# Patient Record
Sex: Female | Born: 1995 | Race: White | Hispanic: No | Marital: Single | Smoking: Former smoker
Health system: Southern US, Community
[De-identification: ages and names within clinical notes are randomized; demographics above are authoritative.]

## PROBLEM LIST (undated history)

## (undated) DIAGNOSIS — R319 Hematuria, unspecified: Secondary | ICD-10-CM

## (undated) DIAGNOSIS — K311 Adult hypertrophic pyloric stenosis: Secondary | ICD-10-CM

## (undated) DIAGNOSIS — R350 Frequency of micturition: Secondary | ICD-10-CM

## (undated) HISTORY — DX: Adult hypertrophic pyloric stenosis: K31.1

## (undated) HISTORY — PX: PYLOROPLASTY: SHX418

## (undated) HISTORY — DX: Hematuria, unspecified: R31.9

## (undated) HISTORY — DX: Frequency of micturition: R35.0

---
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
## 2015-04-08 ENCOUNTER — Ambulatory Visit (INDEPENDENT_AMBULATORY_CARE_PROVIDER_SITE_OTHER): Payer: PRIVATE HEALTH INSURANCE | Admitting: Family Medicine

## 2015-04-08 ENCOUNTER — Encounter: Payer: Self-pay | Admitting: Family Medicine

## 2015-04-08 VITALS — BP 97/65 | HR 78 | Temp 98.3°F | Ht 63.4 in | Wt 116.0 lb

## 2015-04-08 DIAGNOSIS — R509 Fever, unspecified: Secondary | ICD-10-CM

## 2015-04-08 DIAGNOSIS — J029 Acute pharyngitis, unspecified: Secondary | ICD-10-CM

## 2015-04-08 MED ORDER — HYDROCOD POLST-CPM POLST ER 10-8 MG/5ML PO SUER: 5.0000 mL | Freq: Every evening | ORAL | Status: DC | PRN

## 2015-04-08 MED ORDER — BENZONATATE 200 MG PO CAPS
200.0000 mg | ORAL_CAPSULE | Freq: Three times a day (TID) | ORAL | Status: DC | PRN
Start: 2015-04-08 — End: 2015-05-27

## 2015-04-08 NOTE — Progress Notes (Signed)
BP 97/65 mmHg  Pulse 78  Temp(Src) 98.3 F (36.8 C)  Ht 5' 3.4" (1.61 m)  Wt 116 lb (52.617 kg)  BMI 20.30 kg/m2  SpO2 100%  LMP 03/25/2015 (Approximate)   Subjective:    Patient ID: Sherry Welch, female    DOB: [birth year], 19 y.o.   MRN: [id number]  HPI: Sherry Welch is a 19 y.o. female  Chief Complaint  Patient presents with  . Sore Throat    Patient states that she is having some stomach pain, low grade fever and a sore throat.   UPPER RESPIRATORY TRACT INFECTION Duration: 3-4 days Worst symptom: sore throat Fever: yes Cough: yes Shortness of breath: no Wheezing: no Chest pain: no Chest tightness: no Chest congestion: no Nasal congestion: no Runny nose: no Post nasal drip: no Sneezing: no Sore throat: yes Swollen glands: yes Sinus pressure: no Headache: yes Face pain: no Toothache: yes- getting her wisdom teeth in Ear pain: yes "right Ear pressure: yes "right Eyes red/itching:no Eye drainage/crusting: no  Vomiting: no Rash: no Fatigue: yes Sick contacts: yes Strep contacts: no  Context: stable Recurrent sinusitis: no Relief with OTC cold/cough medications: no  Treatments attempted: cold/sinus   Relevant past medical, surgical, family and social history reviewed and updated as indicated. Interim medical history since our last visit reviewed. Allergies and medications reviewed and updated.  Review of Systems  HENT: Negative.   Respiratory: Negative.   Cardiovascular: Negative.   Psychiatric/Behavioral: Negative.     Per HPI unless specifically indicated above     Objective:    BP 97/65 mmHg  Pulse 78  Temp(Src) 98.3 F (36.8 C)  Ht 5' 3.4" (1.61 m)  Wt 116 lb (52.617 kg)  BMI 20.30 kg/m2  SpO2 100%  LMP 03/25/2015 (Approximate)  Wt Readings from Last 3 Encounters:  04/08/15 116 lb (52.617 kg) (26 %*, Z = -0.64)  10/29/14 130 lb (58.968 kg) (56 %*, Z = 0.14)   * Growth percentiles are based on CDC 2-20 Years data.    Physical  Exam  Constitutional: She is oriented to person, place, and time. She appears well-developed and well-nourished. No distress.  HENT:  Head: Normocephalic and atraumatic.  Right Ear: Hearing and external ear normal.  Left Ear: Hearing and external ear normal.  Nose: Nose normal. Right sinus exhibits no maxillary sinus tenderness and no frontal sinus tenderness. Left sinus exhibits no maxillary sinus tenderness and no frontal sinus tenderness.  Mouth/Throat: Uvula is midline and mucous membranes are normal. Posterior oropharyngeal edema and posterior oropharyngeal erythema present. No oropharyngeal exudate or tonsillar abscesses.    Eyes: Conjunctivae, EOM and lids are normal. Pupils are equal, round, and reactive to light. Right eye exhibits no discharge. Left eye exhibits no discharge. No scleral icterus.  Neck: Normal range of motion. Neck supple. No JVD present. No tracheal deviation present. No thyromegaly present.  Cardiovascular: Normal rate, regular rhythm, normal heart sounds and intact distal pulses.  Exam reveals no gallop and no friction rub.   No murmur heard. Pulmonary/Chest: Effort normal and breath sounds normal. No stridor. No respiratory distress. She has no wheezes. She has no rales. She exhibits no tenderness.  Musculoskeletal: Normal range of motion.  Lymphadenopathy:    She has cervical adenopathy.  Neurological: She is alert and oriented to person, place, and time.  Skin: Skin is intact. No rash noted.  Psychiatric: She has a normal mood and affect. Her speech is normal and behavior is normal. Judgment and thought  content normal. Cognition and memory are normal.  Nursing note and vitals reviewed.       Assessment & Plan:   Problem List Items Addressed This Visit    None    Visit Diagnoses    Sore throat    -  Primary    Strep negative. Looks like coxsackie virus. Will treat symptomatically and monitor. Await strep culture. Call if not getting better or getting  worse.     Relevant Orders    Rapid strep screen (not at [facility])    Fever, unspecified fever cause        Relevant Orders    Rapid strep screen (not at [facility])        Follow up plan: Return 6-8 weeks, for for physical.

## 2015-04-10 LAB — RAPID STREP SCREEN (MED CTR MEBANE ONLY): STREP A CULTURE: NEGATIVE

## 2015-05-27 ENCOUNTER — Encounter (INDEPENDENT_AMBULATORY_CARE_PROVIDER_SITE_OTHER): Payer: Self-pay

## 2015-05-27 ENCOUNTER — Encounter: Payer: Self-pay | Admitting: Family Medicine

## 2015-05-27 ENCOUNTER — Ambulatory Visit (INDEPENDENT_AMBULATORY_CARE_PROVIDER_SITE_OTHER): Payer: PRIVATE HEALTH INSURANCE | Admitting: Family Medicine

## 2015-05-27 VITALS — BP 133/87 | HR 65 | Temp 98.7°F | Ht 63.3 in | Wt 116.0 lb

## 2015-05-27 DIAGNOSIS — Z0000 Encounter for general adult medical examination without abnormal findings: Secondary | ICD-10-CM | POA: Diagnosis not present

## 2015-05-27 DIAGNOSIS — Z113 Encounter for screening for infections with a predominantly sexual mode of transmission: Secondary | ICD-10-CM

## 2015-05-27 LAB — UA/M W/RFLX CULTURE, ROUTINE
BILIRUBIN UA: NEGATIVE
Glucose, UA: NEGATIVE
Ketones, UA: NEGATIVE
Leukocytes, UA: NEGATIVE
Nitrite, UA: NEGATIVE
PH UA: 6 (ref 5.0–7.5)
PROTEIN UA: NEGATIVE
RBC, UA: NEGATIVE
Specific Gravity, UA: 1.005 (ref 1.005–1.030)
Urobilinogen, Ur: 0.2 mg/dL (ref 0.2–1.0)

## 2015-05-27 NOTE — Progress Notes (Signed)
BP 133/87 mmHg  Pulse 65  Temp(Src) 98.7 F (37.1 C)  Ht 5' 3.3" (1.608 m)  Wt 116 lb (52.617 kg)  BMI 20.35 kg/m2  SpO2 100%  LMP 05/21/2015 (Approximate)   Subjective:    Patient ID: Sherry Welch, female    DOB: Mar 06, 1996, 20 y.o.   MRN: [id number]  HPI: Sherry Welch is a 20 y.o. female presenting on 05/27/2015 for comprehensive medical examination. Current medical complaints include: none  She currently lives with: boyfriends Menopausal Symptoms: no  Depression Screen done today and results listed below:  Depression screen PHQ 2/9 05/27/2015  Decreased Interest 0  Down, Depressed, Hopeless 0  PHQ - 2 Score 0     Past Medical History:  Past Medical History  Diagnosis Date  . 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 Pyloric stenosis      Repair    Medications:  Current Outpatient Prescriptions on File Prior to Visit  Medication Sig  . SPRINTEC 28 0.25-35 MG-MCG tablet Take 1 tablet by mouth daily.   No current facility-administered medications on file prior to visit.    Allergies:  No Known Allergies  Social History:  Social History   Social History  . Marital Status: Single    Spouse Name: N/A  . Number of Children: N/A  . Years of Education: N/A   Occupational History  . Not on file.   Social History Main Topics  . Smoking status: Never Smoker   . Smokeless tobacco: Never Used  . Alcohol Use: 0.0 oz/week    0 Standard drinks or equivalent per week     Comment: Socially   . Drug Use: No  . Sexual Activity: Yes    Birth Control/ Protection: Pill   Other Topics Concern  . Not on file   Social History Narrative   History  Smoking status  . Never Smoker   Smokeless tobacco  . Never Used   History  Alcohol Use  . 0.0 oz/week  . 0 Standard drinks or equivalent per week    Comment: Socially     Family History:  Family History  Problem Relation Age of Onset  .  Diabetes Maternal Grandmother   . Parkinson's disease Maternal Grandfather     Past medical history, surgical history, medications, allergies, family history and social history reviewed with patient today and changes made to appropriate areas of the chart.   Review of Systems  Constitutional: Negative.   HENT: Negative.   Eyes: Negative.   Respiratory: Negative.   Cardiovascular: Negative.   Gastrointestinal: Negative.   Genitourinary: Negative.   Musculoskeletal: Negative.   Skin: Negative.   Neurological: Negative.   Endo/Heme/Allergies: Negative.   Psychiatric/Behavioral: Negative.     All other ROS negative except what is listed above and in the HPI.      Objective:    BP 133/87 mmHg  Pulse 65  Temp(Src) 98.7 F (37.1 C)  Ht 5' 3.3" (1.608 m)  Wt 116 lb (52.617 kg)  BMI 20.35 kg/m2  SpO2 100%  LMP 05/21/2015 (Approximate)  Wt Readings from Last 3 Encounters:  05/27/15 116 lb (52.617 kg) (26 %*, Z = -0.65)  04/08/15 116 lb (52.617 kg) (26 %*, Z = -0.64)  10/29/14 130 lb (58.968 kg) (56 %*, Z = 0.14)   * Growth percentiles are based on CDC 2-20 Years data.    Physical Exam  Constitutional: She is oriented to person, place, and time. She appears well-developed and well-nourished. No distress.  HENT:  Head: Normocephalic and atraumatic.  Right Ear: Hearing, tympanic membrane, external ear and ear canal normal.  Left Ear: Hearing, tympanic membrane, external ear and ear canal normal.  Nose: Nose normal.  Mouth/Throat: Uvula is midline, oropharynx is clear and moist and mucous membranes are normal. No oropharyngeal exudate.  Eyes: Conjunctivae, EOM and lids are normal. Pupils are equal, round, and reactive to light. Right eye exhibits no discharge. Left eye exhibits no discharge. No scleral icterus.  Neck: Normal range of motion. Neck supple. No JVD present. No tracheal deviation present. No thyromegaly present.  Cardiovascular: Normal rate, regular rhythm, normal  heart sounds and intact distal pulses.  Exam reveals no gallop and no friction rub.   No murmur heard. Pulmonary/Chest: Effort normal and breath sounds normal. No stridor. No respiratory distress. She has no wheezes. She has no rales. She exhibits no tenderness.  Abdominal: Soft. Bowel sounds are normal. She exhibits no distension and no mass. There is no tenderness. There is no rebound and no guarding.  Genitourinary:  Deferred, done at GYN  Musculoskeletal: Normal range of motion. She exhibits no edema or tenderness.  Lymphadenopathy:    She has no cervical adenopathy.  Neurological: She is alert and oriented to person, place, and time. She has normal reflexes. She displays normal reflexes. No cranial nerve deficit. She exhibits normal muscle tone. Coordination normal.  Skin: Skin is warm, dry and intact. No rash noted. No erythema. No pallor.  Psychiatric: She has a normal mood and affect. Her speech is normal and behavior is normal. Judgment and thought content normal. Cognition and memory are normal.  Nursing note and vitals reviewed.   Results for orders placed or performed in visit on 04/08/15  Rapid strep screen (not at [facility])  Result Value Ref Range   Strep A Culture Negative       Assessment & Plan:   Problem List Items Addressed This Visit    None    Visit Diagnoses    Routine general medical examination at a health care facility    -  Primary    Up to date on vaccines. Screening labs checked today. Continue diet and exercise. Continue to monitor.     Relevant Orders    CBC with Differential/Platelet    Comprehensive metabolic panel    Lipid Panel w/o Chol/HDL Ratio    TSH    UA/M w/rflx Culture, Routine    Routine screening for STI (sexually transmitted infection)        Would like to be screened. Labs drawn today. Await results.     Relevant Orders    Hepatitis, Acute    RPR    GC/Chlamydia Probe Amp    HIV antibody    HSV(herpes simplex vrs) 1+2 ab-IgG         Follow up plan: Return in about 1 year (around 05/26/2016) for PE.   LABORATORY TESTING:  - Pap smear: done elsewhere  IMMUNIZATIONS:   - Tdap: Tetanus vaccination status reviewed: last tetanus booster within 10 years. - Influenza: Refused  PATIENT COUNSELING:   Advised to take 1 mg of folate supplement per day if capable of pregnancy.   Sexuality: Discussed sexually transmitted diseases, partner selection, use of condoms, avoidance of unintended pregnancy  and contraceptive alternatives.   Advised to avoid cigarette smoking.  I discussed with the patient that most people either abstain from alcohol  or drink within safe limits (<=14/week and <=4 drinks/occasion for males, <=7/weeks and <= 3 drinks/occasion for females) and that the risk for alcohol disorders and other health effects rises proportionally with the number of drinks per week and how often a drinker exceeds daily limits.  Discussed cessation/primary prevention of drug use and availability of treatment for abuse.   Diet: Encouraged to adjust caloric intake to maintain  or achieve ideal body weight, to reduce intake of dietary saturated fat and total fat, to limit sodium intake by avoiding high sodium foods and not adding table salt, and to maintain adequate dietary potassium and calcium preferably from fresh fruits, vegetables, and low-fat dairy products.    stressed the importance of regular exercise  Injury prevention: Discussed safety belts, safety helmets, smoke detector, smoking near bedding or upholstery.   Dental health: Discussed importance of regular tooth brushing, flossing, and dental visits.    NEXT PREVENTATIVE PHYSICAL DUE IN 1 YEAR. Return in about 1 year (around 05/26/2016) for PE.

## 2015-05-27 NOTE — Patient Instructions (Signed)
Health Maintenance, Female Adopting a healthy lifestyle and getting preventive care can go a long way to promote health and wellness. Talk with your health care provider about what schedule of regular examinations is right for you. This is a good chance for you to check in with your provider about disease prevention and staying healthy. In between checkups, there are plenty of things you can do on your own. Experts have done a lot of research about which lifestyle changes and preventive measures are most likely to keep you healthy. Ask your health care provider for more information. WEIGHT AND DIET  Eat a healthy diet  Be sure to include plenty of vegetables, fruits, low-fat dairy products, and lean protein.  Do not eat a lot of foods high in solid fats, added sugars, or salt.  Get regular exercise. This is one of the most important things you can do for your health.  Most adults should exercise for at least 150 minutes each week. The exercise should increase your heart rate and make you sweat (moderate-intensity exercise).  Most adults should also do strengthening exercises at least twice a week. This is in addition to the moderate-intensity exercise.  Maintain a healthy weight  Body mass index (BMI) is a measurement that can be used to identify possible weight problems. It estimates body fat based on height and weight. Your health care provider can help determine your BMI and help you achieve or maintain a healthy weight.  For females 20 years of age and older:   A BMI below 18.5 is considered underweight.  A BMI of 18.5 to 24.9 is normal.  A BMI of 25 to 29.9 is considered overweight.  A BMI of 30 and above is considered obese.  Watch levels of cholesterol and blood lipids  You should start having your blood tested for lipids and cholesterol at 20 years of age, then have this test every 5 years.  You may need to have your cholesterol levels checked more often if:  Your lipid  or cholesterol levels are high.  You are older than 20 years of age.  You are at high risk for heart disease.  CANCER SCREENING   Lung Cancer  Lung cancer screening is recommended for adults 55-80 years old who are at high risk for lung cancer because of a history of smoking.  A yearly low-dose CT scan of the lungs is recommended for people who:  Currently smoke.  Have quit within the past 15 years.  Have at least a 30-pack-year history of smoking. A pack year is smoking an average of one pack of cigarettes a day for 1 year.  Yearly screening should continue until it has been 15 years since you quit.  Yearly screening should stop if you develop a health problem that would prevent you from having lung cancer treatment.  Breast Cancer  Practice breast self-awareness. This means understanding how your breasts normally appear and feel.  It also means doing regular breast self-exams. Let your health care provider know about any changes, no matter how small.  If you are in your 20s or 30s, you should have a clinical breast exam (CBE) by a health care provider every 1-3 years as part of a regular health exam.  If you are 40 or older, have a CBE every year. Also consider having a breast X-ray (mammogram) every year.  If you have a family history of breast cancer, talk to your health care provider about genetic screening.  If you   are at high risk for breast cancer, talk to your health care provider about having an MRI and a mammogram every year.  Breast cancer gene (BRCA) assessment is recommended for women who have family members with BRCA-related cancers. BRCA-related cancers include:  Breast.  Ovarian.  Tubal.  Peritoneal cancers.  Results of the assessment will determine the need for genetic counseling and BRCA1 and BRCA2 testing. Cervical Cancer Your health care provider may recommend that you be screened regularly for cancer of the pelvic organs (ovaries, uterus, and  vagina). This screening involves a pelvic examination, including checking for microscopic changes to the surface of your cervix (Pap test). You may be encouraged to have this screening done every 3 years, beginning at age 21.  For women ages 30-65, health care providers may recommend pelvic exams and Pap testing every 3 years, or they may recommend the Pap and pelvic exam, combined with testing for human papilloma virus (HPV), every 5 years. Some types of HPV increase your risk of cervical cancer. Testing for HPV may also be done on women of any age with unclear Pap test results.  Other health care providers may not recommend any screening for nonpregnant women who are considered low risk for pelvic cancer and who do not have symptoms. Ask your health care provider if a screening pelvic exam is right for you.  If you have had past treatment for cervical cancer or a condition that could lead to cancer, you need Pap tests and screening for cancer for at least 20 years after your treatment. If Pap tests have been discontinued, your risk factors (such as having a new sexual partner) need to be reassessed to determine if screening should resume. Some women have medical problems that increase the chance of getting cervical cancer. In these cases, your health care provider may recommend more frequent screening and Pap tests. Colorectal Cancer  This type of cancer can be detected and often prevented.  Routine colorectal cancer screening usually begins at 20 years of age and continues through 20 years of age.  Your health care provider may recommend screening at an earlier age if you have risk factors for colon cancer.  Your health care provider may also recommend using home test kits to check for hidden blood in the stool.  A small camera at the end of a tube can be used to examine your colon directly (sigmoidoscopy or colonoscopy). This is done to check for the earliest forms of colorectal  cancer.  Routine screening usually begins at age 50.  Direct examination of the colon should be repeated every 5-10 years through 20 years of age. However, you may need to be screened more often if early forms of precancerous polyps or small growths are found. Skin Cancer  Check your skin from head to toe regularly.  Tell your health care provider about any new moles or changes in moles, especially if there is a change in a mole's shape or color.  Also tell your health care provider if you have a mole that is larger than the size of a pencil eraser.  Always use sunscreen. Apply sunscreen liberally and repeatedly throughout the day.  Protect yourself by wearing long sleeves, pants, a wide-brimmed hat, and sunglasses whenever you are outside. HEART DISEASE, DIABETES, AND HIGH BLOOD PRESSURE   High blood pressure causes heart disease and increases the risk of stroke. High blood pressure is more likely to develop in:  People who have blood pressure in the high end   of the normal range (130-139/85-89 mm Hg).  People who are overweight or obese.  People who are African American.  If you are 38-23 years of age, have your blood pressure checked every 3-5 years. If you are 61 years of age or older, have your blood pressure checked every year. You should have your blood pressure measured twice--once when you are at a hospital or clinic, and once when you are not at a hospital or clinic. Record the average of the two measurements. To check your blood pressure when you are not at a hospital or clinic, you can use:  An automated blood pressure machine at a pharmacy.  A home blood pressure monitor.  If you are between 45 years and 39 years old, ask your health care provider if you should take aspirin to prevent strokes.  Have regular diabetes screenings. This involves taking a blood sample to check your fasting blood sugar level.  If you are at a normal weight and have a low risk for diabetes,  have this test once every three years after 20 years of age.  If you are overweight and have a high risk for diabetes, consider being tested at a younger age or more often. PREVENTING INFECTION  Hepatitis B  If you have a higher risk for hepatitis B, you should be screened for this virus. You are considered at high risk for hepatitis B if:  You were born in a country where hepatitis B is common. Ask your health care provider which countries are considered high risk.  Your parents were born in a high-risk country, and you have not been immunized against hepatitis B (hepatitis B vaccine).  You have HIV or AIDS.  You use needles to inject street drugs.  You live with someone who has hepatitis B.  You have had sex with someone who has hepatitis B.  You get hemodialysis treatment.  You take certain medicines for conditions, including cancer, organ transplantation, and autoimmune conditions. Hepatitis C  Blood testing is recommended for:  Everyone born from [birth year] through 1965.  Anyone with known risk factors for hepatitis C. Sexually transmitted infections (STIs)  You should be screened for sexually transmitted infections (STIs) including gonorrhea and chlamydia if:  You are sexually active and are younger than 20 years of age.  You are older than 20 years of age and your health care provider tells you that you are at risk for this type of infection.  Your sexual activity has changed since you were last screened and you are at an increased risk for chlamydia or gonorrhea. Ask your health care provider if you are at risk.  If you do not have HIV, but are at risk, it may be recommended that you take a prescription medicine daily to prevent HIV infection. This is called pre-exposure prophylaxis (PrEP). You are considered at risk if:  You are sexually active and do not regularly use condoms or know the HIV status of your partner(s).  You take drugs by injection.  You are sexually  active with a partner who has HIV. Talk with your health care provider about whether you are at high risk of being infected with HIV. If you choose to begin PrEP, you should first be tested for HIV. You should then be tested every 3 months for as long as you are taking PrEP.  PREGNANCY   If you are premenopausal and you may become pregnant, ask your health care provider about preconception counseling.  If you may  become pregnant, take 400 to 800 micrograms (mcg) of folic acid every day.  If you want to prevent pregnancy, talk to your health care provider about birth control (contraception). OSTEOPOROSIS AND MENOPAUSE   Osteoporosis is a disease in which the bones lose minerals and strength with aging. This can result in serious bone fractures. Your risk for osteoporosis can be identified using a bone density scan.  If you are 61 years of age or older, or if you are at risk for osteoporosis and fractures, ask your health care provider if you should be screened.  Ask your health care provider whether you should take a calcium or vitamin D supplement to lower your risk for osteoporosis.  Menopause may have certain physical symptoms and risks.  Hormone replacement therapy may reduce some of these symptoms and risks. Talk to your health care provider about whether hormone replacement therapy is right for you.  HOME CARE INSTRUCTIONS   Schedule regular health, dental, and eye exams.  Stay current with your immunizations.   Do not use any tobacco products including cigarettes, chewing tobacco, or electronic cigarettes.  If you are pregnant, do not drink alcohol.  If you are breastfeeding, limit how much and how often you drink alcohol.  Limit alcohol intake to no more than 1 drink per day for nonpregnant women. One drink equals 12 ounces of beer, 5 ounces of wine, or 1 ounces of hard liquor.  Do not use street drugs.  Do not share needles.  Ask your health care provider for help if  you need support or information about quitting drugs.  Tell your health care provider if you often feel depressed.  Tell your health care provider if you have ever been abused or do not feel safe at home.   This information is not intended to replace advice given to you by your health care provider. Make sure you discuss any questions you have with your health care provider.   Document Released: 11/23/2010 Document Revised: 05/31/2014 Document Reviewed: 04/11/2013 Elsevier Interactive Patient Education [employer].

## 2015-05-28 ENCOUNTER — Telehealth: Payer: Self-pay | Admitting: Family Medicine

## 2015-05-28 DIAGNOSIS — D509 Iron deficiency anemia, unspecified: Secondary | ICD-10-CM | POA: Insufficient documentation

## 2015-05-28 LAB — CBC WITH DIFFERENTIAL/PLATELET
BASOS ABS: 0.1 10*3/uL (ref 0.0–0.2)
Basos: 3 %
EOS (ABSOLUTE): 0.2 10*3/uL (ref 0.0–0.4)
Eos: 4 %
HEMOGLOBIN: 7.4 g/dL — AB (ref 11.1–15.9)
Hematocrit: 26.1 % — ABNORMAL LOW (ref 34.0–46.6)
IMMATURE GRANULOCYTES: 0 %
Immature Grans (Abs): 0 10*3/uL (ref 0.0–0.1)
LYMPHS: 45 %
Lymphocytes Absolute: 1.7 10*3/uL (ref 0.7–3.1)
MCH: 18.7 pg — AB (ref 26.6–33.0)
MCHC: 28.4 g/dL — ABNORMAL LOW (ref 31.5–35.7)
MCV: 66 fL — ABNORMAL LOW (ref 79–97)
MONOCYTES: 7 %
Monocytes Absolute: 0.3 10*3/uL (ref 0.1–0.9)
NEUTROS ABS: 1.6 10*3/uL (ref 1.4–7.0)
Neutrophils: 41 %
Platelets: 190 10*3/uL (ref 150–379)
RBC: 3.95 x10E6/uL (ref 3.77–5.28)
RDW: 19.2 % — ABNORMAL HIGH (ref 12.3–15.4)
WBC: 3.8 10*3/uL (ref 3.4–10.8)

## 2015-05-28 LAB — COMPREHENSIVE METABOLIC PANEL
A/G RATIO: 1.5 (ref 1.1–2.5)
ALT: 18 IU/L (ref 0–32)
AST: 23 IU/L (ref 0–40)
Albumin: 4.2 g/dL (ref 3.5–5.5)
Alkaline Phosphatase: 62 IU/L (ref 39–117)
BILIRUBIN TOTAL: 0.8 mg/dL (ref 0.0–1.2)
BUN/Creatinine Ratio: 13 (ref 8–20)
BUN: 10 mg/dL (ref 6–20)
CALCIUM: 9 mg/dL (ref 8.7–10.2)
CO2: 21 mmol/L (ref 18–29)
Chloride: 102 mmol/L (ref 96–106)
Creatinine, Ser: 0.8 mg/dL (ref 0.57–1.00)
GFR calc Af Amer: 124 mL/min/{1.73_m2} (ref >59)
GFR calc non Af Amer: 107 mL/min/{1.73_m2} (ref >59)
Globulin, Total: 2.8 g/dL (ref 1.5–4.5)
Glucose: 91 mg/dL (ref 65–99)
POTASSIUM: 3.9 mmol/L (ref 3.5–5.2)
Sodium: 141 mmol/L (ref 134–144)
Total Protein: 7 g/dL (ref 6.0–8.5)

## 2015-05-28 LAB — LIPID PANEL W/O CHOL/HDL RATIO
CHOLESTEROL TOTAL: 143 mg/dL (ref 100–169)
HDL: 64 mg/dL (ref >39)
LDL CALC: 63 mg/dL (ref 0–109)
TRIGLYCERIDES: 78 mg/dL (ref 0–89)
VLDL Cholesterol Cal: 16 mg/dL (ref 5–40)

## 2015-05-28 LAB — TSH: TSH: 1.46 u[IU]/mL (ref 0.450–4.500)

## 2015-05-28 LAB — HEPATITIS PANEL, ACUTE
HEP B C IGM: NEGATIVE
HEP B S AG: NEGATIVE
Hep A IgM: NEGATIVE

## 2015-05-28 LAB — HSV(HERPES SIMPLEX VRS) I + II AB-IGG: HSV 1 Glycoprotein G Ab, IgG: <0.91 index (ref 0.00–0.90)

## 2015-05-28 LAB — RPR: RPR Ser Ql: NONREACTIVE

## 2015-05-28 LAB — GC/CHLAMYDIA PROBE AMP
CHLAMYDIA, DNA PROBE: NEGATIVE
NEISSERIA GONORRHOEAE BY PCR: NEGATIVE

## 2015-05-28 LAB — HIV ANTIBODY (ROUTINE TESTING W REFLEX): HIV Screen 4th Generation wRfx: NONREACTIVE

## 2015-05-28 MED ORDER — FERROUS SULFATE 325 (65 FE) MG PO TABS: 325.0000 mg | ORAL_TABLET | Freq: Two times a day (BID) | ORAL | Status: DC

## 2015-05-28 NOTE — Telephone Encounter (Signed)
Called to tell Sherry Welch her lab work. Normal except anemia. Will start iron supplement. Will recheck in 2 weeks.

## 2015-05-29 ENCOUNTER — Encounter: Payer: Self-pay | Admitting: Family Medicine

## 2015-06-13 ENCOUNTER — Other Ambulatory Visit: Payer: PRIVATE HEALTH INSURANCE

## 2015-06-13 DIAGNOSIS — D509 Iron deficiency anemia, unspecified: Secondary | ICD-10-CM

## 2015-06-14 LAB — CBC WITH DIFFERENTIAL/PLATELET
Basophils Absolute: 0.1 10*3/uL (ref 0.0–0.2)
Basos: 1 %
EOS (ABSOLUTE): 0.1 10*3/uL (ref 0.0–0.4)
EOS: 2 %
HEMATOCRIT: 32.1 % — AB (ref 34.0–46.6)
HEMOGLOBIN: 9.4 g/dL — AB (ref 11.1–15.9)
Immature Grans (Abs): 0 10*3/uL (ref 0.0–0.1)
Immature Granulocytes: 0 %
LYMPHS ABS: 2.5 10*3/uL (ref 0.7–3.1)
Lymphs: 40 %
MCH: 21.5 pg — AB (ref 26.6–33.0)
MCHC: 29.3 g/dL — AB (ref 31.5–35.7)
MCV: 73 fL — AB (ref 79–97)
MONOCYTES: 7 %
MONOS ABS: 0.5 10*3/uL (ref 0.1–0.9)
NEUTROS ABS: 3.2 10*3/uL (ref 1.4–7.0)
Neutrophils: 50 %
Platelets: 872 10*3/uL (ref 150–379)
RBC: 4.38 x10E6/uL (ref 3.77–5.28)
RDW: 28.6 % — ABNORMAL HIGH (ref 12.3–15.4)
WBC: 6.4 10*3/uL (ref 3.4–10.8)

## 2015-06-16 ENCOUNTER — Telehealth: Payer: Self-pay | Admitting: Family Medicine

## 2015-06-16 DIAGNOSIS — D473 Essential (hemorrhagic) thrombocythemia: Secondary | ICD-10-CM | POA: Insufficient documentation

## 2015-06-16 DIAGNOSIS — D75839 Thrombocytosis, unspecified: Secondary | ICD-10-CM | POA: Insufficient documentation

## 2015-06-16 NOTE — Telephone Encounter (Signed)
Platelets very high. Will recheck tomorrow to make sure not something with the lab.

## 2015-06-17 ENCOUNTER — Other Ambulatory Visit: Payer: PRIVATE HEALTH INSURANCE

## 2015-06-17 DIAGNOSIS — D75839 Thrombocytosis, unspecified: Secondary | ICD-10-CM

## 2015-06-17 DIAGNOSIS — D473 Essential (hemorrhagic) thrombocythemia: Secondary | ICD-10-CM

## 2015-06-18 ENCOUNTER — Telehealth: Payer: Self-pay | Admitting: Family Medicine

## 2015-06-18 DIAGNOSIS — D75839 Thrombocytosis, unspecified: Secondary | ICD-10-CM

## 2015-06-18 DIAGNOSIS — D473 Essential (hemorrhagic) thrombocythemia: Secondary | ICD-10-CM

## 2015-06-18 DIAGNOSIS — D509 Iron deficiency anemia, unspecified: Secondary | ICD-10-CM

## 2015-06-18 LAB — CBC WITH DIFFERENTIAL/PLATELET
BASOS: 1 %
Basophils Absolute: 0 10*3/uL (ref 0.0–0.2)
EOS (ABSOLUTE): 0.1 10*3/uL (ref 0.0–0.4)
EOS: 3 %
HEMATOCRIT: 34 % (ref 34.0–46.6)
HEMOGLOBIN: 9.7 g/dL — AB (ref 11.1–15.9)
IMMATURE GRANS (ABS): 0 10*3/uL (ref 0.0–0.1)
Immature Granulocytes: 0 %
LYMPHS: 41 %
Lymphocytes Absolute: 1.9 10*3/uL (ref 0.7–3.1)
MCH: 22.2 pg — ABNORMAL LOW (ref 26.6–33.0)
MCHC: 28.5 g/dL — ABNORMAL LOW (ref 31.5–35.7)
MCV: 78 fL — AB (ref 79–97)
MONOS ABS: 0.5 10*3/uL (ref 0.1–0.9)
Monocytes: 9 %
NEUTROS ABS: 2.2 10*3/uL (ref 1.4–7.0)
Neutrophils: 46 %
PLATELETS: 583 10*3/uL — AB (ref 150–379)
RBC: 4.37 x10E6/uL (ref 3.77–5.28)
WBC: 4.8 10*3/uL (ref 3.4–10.8)

## 2015-06-18 NOTE — Telephone Encounter (Signed)
Sherry Welch, can we get her on the lab schedule for a month from now? Thanks!

## 2015-06-18 NOTE — Telephone Encounter (Signed)
Called patient with the results. Platelet count much better, blood count better. Likely acute phase reactant. Will check back in 1 month to make sure platelets back down to normal. No need for heme referral at this time.

## 2015-07-21 ENCOUNTER — Other Ambulatory Visit: Payer: Self-pay

## 2016-05-27 ENCOUNTER — Encounter: Payer: PRIVATE HEALTH INSURANCE | Admitting: Family Medicine

## 2017-01-13 ENCOUNTER — Ambulatory Visit (INDEPENDENT_AMBULATORY_CARE_PROVIDER_SITE_OTHER): Payer: PRIVATE HEALTH INSURANCE | Admitting: Family Medicine

## 2017-01-13 VITALS — BP 124/60 | HR 105 | Wt 120.0 lb

## 2017-01-13 DIAGNOSIS — R109 Unspecified abdominal pain: Secondary | ICD-10-CM | POA: Diagnosis not present

## 2017-01-13 DIAGNOSIS — N760 Acute vaginitis: Secondary | ICD-10-CM | POA: Diagnosis not present

## 2017-01-13 DIAGNOSIS — B9689 Other specified bacterial agents as the cause of diseases classified elsewhere: Secondary | ICD-10-CM

## 2017-01-13 LAB — WET PREP FOR TRICH, YEAST, CLUE
CLUE CELL EXAM: POSITIVE — AB
Trichomonas Exam: NEGATIVE
YEAST EXAM: POSITIVE — AB

## 2017-01-13 LAB — URINALYSIS, ROUTINE W REFLEX MICROSCOPIC
Bilirubin, UA: NEGATIVE
Glucose, UA: NEGATIVE
Ketones, UA: NEGATIVE
LEUKOCYTES UA: NEGATIVE
Nitrite, UA: NEGATIVE
PH UA: 7.5 (ref 5.0–7.5)
RBC, UA: NEGATIVE
Specific Gravity, UA: 1.02 (ref 1.005–1.030)
Urobilinogen, Ur: 0.2 mg/dL (ref 0.2–1.0)

## 2017-01-13 LAB — MICROSCOPIC EXAMINATION: Bacteria, UA: NONE SEEN

## 2017-01-13 LAB — PREGNANCY, URINE: Preg Test, Ur: NEGATIVE

## 2017-01-13 MED ORDER — METRONIDAZOLE 500 MG PO TABS: 500.0000 mg | ORAL_TABLET | Freq: Two times a day (BID) | ORAL | 0 refills | Status: DC

## 2017-01-13 MED ORDER — TERBINAFINE HCL 250 MG PO TABS: 250.0000 mg | ORAL_TABLET | Freq: Every day | ORAL | 1 refills | Status: DC

## 2017-01-13 NOTE — Progress Notes (Signed)
BP 124/60   Pulse (!) 105   Wt 120 lb (54.4 kg)   SpO2 99%   BMI 21.06 kg/m    Subjective:    Patient ID: BLANKA ROCKHOLT, female    DOB: July 02, 1995, 21 y.o.   MRN: [id number]  HPI: ASHAWNA HANBACK is a 21 y.o. female  Chief Complaint  Patient presents with  . Vaginal Discharge  . Vaginal Itching  Patient last period 2 weeks ago taking birth control faithfully. Has developed some vaginal itching treated with over-the-counter but now some discharge but no associated smell. Having some suprapubic pressure and urinary discomfort.  Relevant past medical, surgical, family and social history reviewed and updated as indicated. Interim medical history since our last visit reviewed. Allergies and medications reviewed and updated.  Review of Systems  Constitutional: Negative.   Respiratory: Negative.   Cardiovascular: Negative.     Per HPI unless specifically indicated above     Objective:    BP 124/60   Pulse (!) 105   Wt 120 lb (54.4 kg)   SpO2 99%   BMI 21.06 kg/m   Wt Readings from Last 3 Encounters:  01/13/17 120 lb (54.4 kg)  05/27/15 116 lb (52.6 kg) (26 %, Z= -0.65)*  04/08/15 116 lb (52.6 kg) (26 %, Z= -0.64)*   * Growth percentiles are based on CDC 2-20 Years data.    Physical Exam  Constitutional: She is oriented to person, place, and time. She appears well-developed and well-nourished.  HENT:  Head: Normocephalic and atraumatic.  Eyes: Conjunctivae and EOM are normal.  Neck: Normal range of motion.  Cardiovascular: Normal rate, regular rhythm and normal heart sounds.   Pulmonary/Chest: Effort normal and breath sounds normal.  Musculoskeletal: Normal range of motion.  Neurological: She is alert and oriented to person, place, and time.  Skin: No erythema.  Psychiatric: She has a normal mood and affect. Her behavior is normal. Judgment and thought content normal.   vagina with thick discharge sent for wet prep which showed clue cells and yeast  cells.  Results for orders placed or performed in visit on 06/17/15  CBC with Differential/Platelet  Result Value Ref Range   WBC 4.8 3.4 - 10.8 x10E3/uL   RBC 4.37 3.77 - 5.28 x10E6/uL   Hemoglobin 9.7 (L) 11.1 - 15.9 g/dL   Hematocrit 34.0 34.0 - 46.6 %   MCV 78 (L) 79 - 97 fL   MCH 22.2 (L) 26.6 - 33.0 pg   MCHC 28.5 (L) 31.5 - 35.7 g/dL   Platelets 583 (H) 150 - 379 x10E3/uL   Neutrophils 46 %   Lymphs 41 %   Monocytes 9 %   Eos 3 %   Basos 1 %   Neutrophils Absolute 2.2 1.4 - 7.0 x10E3/uL   Lymphocytes Absolute 1.9 0.7 - 3.1 x10E3/uL   Monocytes Absolute 0.5 0.1 - 0.9 x10E3/uL   EOS (ABSOLUTE) 0.1 0.0 - 0.4 x10E3/uL   Basophils Absolute 0.0 0.0 - 0.2 x10E3/uL   Immature Granulocytes 0 %   Immature Grans (Abs) 0.0 0.0 - 0.1 x10E3/uL   Hematology Comments: Note:       Assessment & Plan:   Problem List Items Addressed This Visit    None    Visit Diagnoses    Abdominal cramping    -  Primary   Relevant Orders   Urinalysis, Routine w reflex microscopic   Pregnancy, urine   Wet Prep for Trich, Yeast, Clue (STAT)   Bacterial vaginosis  Discussed care and treatment of DVT also a yeast infection discuss alcohol and Flagyl patient does not drink   Relevant Medications   metroNIDAZOLE (FLAGYL) 500 MG tablet   terbinafine (LAMISIL) 250 MG tablet       Follow up plan: Return if symptoms worsen or fail to improve, for As scheduled.

## 2017-01-14 ENCOUNTER — Ambulatory Visit: Payer: Self-pay | Admitting: Obstetrics and Gynecology

## 2017-05-20 ENCOUNTER — Other Ambulatory Visit: Payer: Self-pay | Admitting: Obstetrics & Gynecology

## 2017-06-09 ENCOUNTER — Ambulatory Visit: Payer: PRIVATE HEALTH INSURANCE | Admitting: Obstetrics and Gynecology

## 2017-06-09 ENCOUNTER — Encounter: Payer: Self-pay | Admitting: Obstetrics and Gynecology

## 2017-06-09 ENCOUNTER — Other Ambulatory Visit: Payer: Self-pay

## 2017-06-09 VITALS — BP 124/80 | HR 88 | Ht 63.0 in | Wt 119.0 lb

## 2017-06-09 DIAGNOSIS — R102 Pelvic and perineal pain: Secondary | ICD-10-CM | POA: Diagnosis not present

## 2017-06-09 DIAGNOSIS — N938 Other specified abnormal uterine and vaginal bleeding: Secondary | ICD-10-CM | POA: Diagnosis not present

## 2017-06-09 DIAGNOSIS — R634 Abnormal weight loss: Secondary | ICD-10-CM | POA: Diagnosis not present

## 2017-06-09 LAB — POCT URINALYSIS DIPSTICK
BILIRUBIN UA: NEGATIVE
GLUCOSE UA: NEGATIVE
Ketones, UA: NEGATIVE
LEUKOCYTES UA: NEGATIVE
Nitrite, UA: NEGATIVE
Protein, UA: NEGATIVE
RBC UA: NEGATIVE
SPEC GRAV UA: 1.01 (ref 1.010–1.025)
pH, UA: 6 (ref 5.0–8.0)

## 2017-06-09 LAB — POCT URINE PREGNANCY: Preg Test, Ur: NEGATIVE

## 2017-06-09 NOTE — Progress Notes (Addendum)
Chief Complaint  Patient presents with  . Abdominal Pain    Lower Abdominal Pain x1 wk intermittent/4 days constant    HPI:      [name] is a 22 y.o. No obstetric history on file. who LMP was Patient's last menstrual period was 05/19/2017., presents today for 4 days mid cycle spotting after missing OCP last wk. Had a day of spotting after missed pill that resolved, until bleeding started again 06/06/17. Pt developed pelvic cramping/pain after sex 2 days ago. Cramping is intermittent and feels a little different than menstrual cramps. No aggrav/allev factors. Has tried NSAIDs with a little relief. NO vag sx, urin sx, GI sx. No fevers. No new sex partners. Occas LBP. Neg UPT last night.   Pt also concerned about 7-10# wt loss this yr. She is eating normally, feels great, no fatigue. Wt was 118# on our scales 9/16 and 125# 10/17. Family keeps mentioning it to pt.   Past Medical History:  Diagnosis Date  . 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 pyloric stenosis     Repair    Family History  Problem Relation Age of Onset  . Diabetes Maternal Grandmother   . Parkinson's disease Maternal Grandfather     Social History   Socioeconomic History  . Marital status: Single    Spouse name: Not on file  . Number of children: Not on file  . Years of education: Not on file  . Highest education level: Not on file  Social Needs  . Financial resource strain: Not on file  . Food insecurity - worry: Not on file  . Food insecurity - inability: Not on file  . Transportation needs - medical: Not on file  . Transportation needs - non-medical: Not on file  Occupational History  . Not on file  Tobacco Use  . Smoking status: Current Every Day Smoker    Packs/day: 0.01    Types: Cigarettes  . Smokeless tobacco: Never Used  . Tobacco comment: Social Smoker (Occasionally)  Substance and Sexual Activity  . Alcohol use: Yes   Alcohol/week: 0.0 oz    Comment: Socially   . Drug use: No  . Sexual activity: Yes    Birth control/protection: Pill  Other Topics Concern  . Not on file  Social History Narrative  . Not on file     Current Outpatient Medications:  .  SPRINTEC 28 0.25-35 MG-MCG tablet, TAKE 1 TABLET BY MOUTH DAILY, Disp: 84 tablet, Rfl: 3 .  metroNIDAZOLE (FLAGYL) 500 MG tablet, Take 1 tablet (500 mg total) by mouth 2 (two) times daily. (Patient not taking: Reported on 06/09/2017), Disp: 14 tablet, Rfl: 0 .  terbinafine (LAMISIL) 250 MG tablet, Take 1 tablet (250 mg total) by mouth daily. (Patient not taking: Reported on 06/09/2017), Disp: 1 tablet, Rfl: 1   ROS:  Review of Systems  Constitutional: Negative for fever.  Gastrointestinal: Negative for blood in stool, constipation, diarrhea, nausea and vomiting.  Genitourinary: Positive for dyspareunia, pelvic pain, vaginal bleeding and vaginal discharge. Negative for dysuria, flank pain, frequency, hematuria, urgency and vaginal pain.  Musculoskeletal: Negative for back pain.  Skin: Negative for rash.     OBJECTIVE:   Vitals:  BP 124/80 (BP Location: Left Arm, Patient Position: Sitting, Cuff Size: Normal)   Pulse 88   Ht 5\' 3"  (1.6 m)   Wt 119 lb (54 kg)   LMP 05/19/2017  BMI 21.08 kg/m   Physical Exam  Constitutional: She is oriented to person, place, and time and well-developed, well-nourished, and in no distress. Vital signs are normal.  Abdominal: Soft. She exhibits no mass. There is tenderness in the right lower quadrant, suprapubic area and left lower quadrant. There is no rigidity and no guarding.  Genitourinary: Vagina normal, cervix normal and vulva normal. Uterus is tender. Uterus is not enlarged. Cervix exhibits no motion tenderness and no tenderness. Right adnexum displays tenderness. Right adnexum displays no mass. Left adnexum displays tenderness. Left adnexum displays no mass. Vulva exhibits no erythema, no exudate, no lesion,  no rash and no tenderness. Vagina exhibits no lesion.  Neurological: She is oriented to person, place, and time.  Vitals reviewed.   Results: Results for orders placed or performed in visit on 06/09/17 (from the past 24 hour(s))  POCT Urinalysis Dipstick     Status: Normal   Collection Time: 06/09/17  5:08 PM  Result Value Ref Range   Color, UA yellow    Clarity, UA clear    Glucose, UA neg    Bilirubin, UA neg    Ketones, UA neg    Spec Grav, UA 1.010 1.010 - 1.025   Blood, UA neg    pH, UA 6.0 5.0 - 8.0   Protein, UA neg    Urobilinogen, UA  0.2 or 1.0 E.U./dL   Nitrite, UA neg    Leukocytes, UA Negative Negative   Appearance     Odor    POCT urine pregnancy     Status: Normal   Collection Time: 06/09/17  5:08 PM  Result Value Ref Range   Preg Test, Ur Negative Negative     Assessment/Plan: Pelvic pain - Neg dip/UPT. Slightly tender on exam. Most likely irreg cycle after missed OCP. Check GYN u/s. Will f/u with results. IF neg, follow next cycle. NSAIDs.  - Plan: US PELVIS TRANSVANGINAL NON-OB (TV ONLY), POCT Urinalysis Dipstick, POCT urine pregnancy, US Pelvis Complete, CANCELED: US PELVIS TRANSVANGINAL NON-OB (TV ONLY)  DUB (dysfunctional uterine bleeding) - Plan: US PELVIS TRANSVANGINAL NON-OB (TV ONLY), POCT urine pregnancy, US Pelvis Complete, CANCELED: US PELVIS TRANSVANGINAL NON-OB (TV ONLY)  Weight loss - Pt feels fine otherwise, eating normally. Due for annual--can do labs then since lab closed today.    Return in about 1 month (around 07/10/2017) for annual with [facility].  Alicia B. Copland, PA-C 06/10/2017 1:19 PM

## 2017-06-09 NOTE — Patient Instructions (Signed)
I value your feedback and entrusting us with your care. If you get a [facility] patient survey, I would appreciate you taking the time to let us know about your experience today. Thank you! 

## 2017-06-10 NOTE — Addendum Note (Signed)
Addended by: Ardeth Perfect B on: 7/84/7841 01:19 PM   Modules accepted: Orders

## 2017-06-16 ENCOUNTER — Telehealth: Payer: Self-pay

## 2017-06-16 NOTE — Telephone Encounter (Signed)
Will wait to see what u/s shows tomorrow. Getting it done for pelvic pain.

## 2017-06-16 NOTE — Telephone Encounter (Signed)
Pt has u/s sched for tomorrow.  Period started yesterday, is having super severe hot flashes to the point she is sweating out of her clothes, then she is freezing, has diarrhea, n/v.  States this has happened the last 97m and she just now noticed the pattern.  Annual is sched. 2/18.  What to do?  [phone]

## 2017-06-16 NOTE — Telephone Encounter (Signed)
Pt aware, would like to have results tomorrow.  Adv I'd let ABC know.

## 2017-06-17 ENCOUNTER — Ambulatory Visit (HOSPITAL_COMMUNITY): Payer: PRIVATE HEALTH INSURANCE

## 2017-06-20 NOTE — Telephone Encounter (Signed)
Pt never had u/s Friday--looks like she cancelled it. I just want to be able to follow up with her sx and review her report. Can you call pt to f/u with her plan? Thx.

## 2017-06-20 NOTE — Telephone Encounter (Signed)
Pt cancelled u/s b/c it was going to cost her over $1000.  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  She does states that right now she is not having the pelvic pain.  She just doesn't want to get sick again with her next period - projectile vomiting for one day, sweating so bad she can't stand to put clothes on, etc.  Please schedule c [facility].

## 2017-06-21 NOTE — Telephone Encounter (Signed)
Patient returned ABC call, same cb

## 2017-06-21 NOTE — Telephone Encounter (Signed)
Spoke with pt. Cramping/pelvic pain resolved after menses started, but resumed today. Mild discomfort but noticeable to pt. Getting u/s sched this wk at [facility]. Will f/u with results.  Pt also with severe n/v/d with menses last wk. Excessive sweating/feeling hot then chilled, but no fever. Had lesser sx previous 2 menses, and saw urgent care. Thought is was viral then, but pt now noticing pattern. Couldn't keep any food/water down. Finally feeling better yesterday after starting new pill pack. Pt had labs done at Urgent Care last wk. She will get them to fax [place] copy so I can review. Will order addl labs prn.

## 2017-06-21 NOTE — Telephone Encounter (Signed)
Pt called back. Got labs results via [facility]. CMP, CBC, thyroid panel WNL. Neg UPT, neg UA. Will wait to see what u/s then shows. If neg, may benefit from cont dosing OCPs.

## 2017-06-21 NOTE — Telephone Encounter (Signed)
LMTRC

## 2017-06-21 NOTE — Addendum Note (Signed)
Addended by: Ardeth Perfect B on: 8/47/8412 11:45 AM   Modules accepted: Orders

## 2017-06-22 ENCOUNTER — Telehealth: Payer: Self-pay | Admitting: Obstetrics and Gynecology

## 2017-06-22 NOTE — Telephone Encounter (Signed)
Patient has not heard from [facility] to have u/s scheduled, was told to let ABC know if she hadn't heard by the end of the day.

## 2017-06-23 NOTE — Telephone Encounter (Signed)
Can you pls check on this? You said referral was sent and they were supposed to contact her. Thx.

## 2017-06-23 NOTE — Telephone Encounter (Signed)
Thank you :)

## 2017-06-23 NOTE — Telephone Encounter (Signed)
Patient is aware of appointment, location, and instructions.

## 2017-06-23 NOTE — Telephone Encounter (Signed)
Patient is scheduled for Tuesday, 06/28/17 @ 4pm at [facility]. Patient should arrive @ 3:40pm w/ a full bladder. Lmtrc.

## 2017-06-27 NOTE — Telephone Encounter (Signed)
Has GYN u/s sched for 06/28/17.

## 2017-06-28 ENCOUNTER — Ambulatory Visit
Admission: RE | Admit: 2017-06-28 | Discharge: 2017-06-28 | Disposition: A | Discharge status: Home / Self Care | Payer: PRIVATE HEALTH INSURANCE | Source: Ambulatory Visit | Attending: Obstetrics and Gynecology | Admitting: Obstetrics and Gynecology

## 2017-06-28 DIAGNOSIS — N938 Other specified abnormal uterine and vaginal bleeding: Secondary | ICD-10-CM

## 2017-06-28 DIAGNOSIS — R102 Pelvic and perineal pain: Secondary | ICD-10-CM

## 2017-06-29 NOTE — Telephone Encounter (Signed)
Pt aware of neg GYN u/s for pelvic pain/cramping. Try cont dosing of sprintec to help with n/v/d sx with menses. Has annual/f/u with [facility] this month.

## 2017-07-04 NOTE — Telephone Encounter (Signed)
Pt doing cont dosing of sprintec for bad sx with periods. Pt wondered if she should be on 3-mo pill Rx. Recommended trying cont dosing with product she is on now. If sx controlled, can change pills. Has f/u with [facility] 06/1817 anyway. Doing well so far.

## 2017-07-04 NOTE — Telephone Encounter (Signed)
Pt calling triage today having some more questions about OCP' and how she is taking them. Wondering if there are any other pill options for her to take. Requesting to speak with ABC.

## 2017-07-11 ENCOUNTER — Ambulatory Visit (INDEPENDENT_AMBULATORY_CARE_PROVIDER_SITE_OTHER): Payer: PRIVATE HEALTH INSURANCE | Admitting: Obstetrics & Gynecology

## 2017-07-11 ENCOUNTER — Encounter: Payer: Self-pay | Admitting: Obstetrics & Gynecology

## 2017-07-11 VITALS — BP 110/60 | Ht 63.0 in | Wt 120.0 lb

## 2017-07-11 DIAGNOSIS — Z0000 Encounter for general adult medical examination without abnormal findings: Secondary | ICD-10-CM | POA: Diagnosis not present

## 2017-07-11 DIAGNOSIS — Z113 Encounter for screening for infections with a predominantly sexual mode of transmission: Secondary | ICD-10-CM | POA: Diagnosis not present

## 2017-07-11 DIAGNOSIS — Z124 Encounter for screening for malignant neoplasm of cervix: Secondary | ICD-10-CM | POA: Diagnosis not present

## 2017-07-11 MED ORDER — NORETHIN-ETH ESTRAD-FE BIPHAS 1 MG-10 MCG / 10 MCG PO TABS: 1.0000 | ORAL_TABLET | Freq: Every day | ORAL | 3 refills | Status: DC

## 2017-07-11 NOTE — Progress Notes (Signed)
HPI:      [name] is a 22 y.o. [id number] who LMP was Patient's last menstrual period was 06/13/2017., she presents today for her annual examination. The patient has complaints today of PREMENSTRUAL NAUSEA and has been trying extended OCP use to reduce this, but prefers to be on method w monthly period. The patient is sexually active. Her no prior h/o STD. The patient does perform self breast exams.  There is no notable family history of breast or ovarian cancer in her family.  The patient has regular exercise: yes.  The patient denies current symptoms of depression.    GYN History: Contraception: OCP (estrogen/progesterone)  PMHx: Past Medical History:  Diagnosis Date  . Hematuria   . 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 Pyloric stenosis   . Urinary frequency    Past Surgical History:  Procedure Laterality Date  . pyloric stenosis     Repair   Family History  Problem Relation Age of Onset  . Diabetes Maternal Grandmother   . Parkinson's disease Maternal Grandfather   . Breast cancer Maternal Aunt    Social History   Tobacco Use  . Smoking status: Current Every Day Smoker    Packs/day: 0.01    Types: Cigarettes  . Smokeless tobacco: Never Used  . Tobacco comment: Social Smoker (Occasionally)  Substance Use Topics  . Alcohol use: Yes    Alcohol/week: 0.0 oz    Comment: Socially   . Drug use: No    Current Outpatient Medications:  .  terbinafine (LAMISIL) 250 MG tablet, Take 1 tablet (250 mg total) by mouth daily., Disp: 1 tablet, Rfl: 1 .  metroNIDAZOLE (FLAGYL) 500 MG tablet, Take 1 tablet (500 mg total) by mouth 2 (two) times daily. (Patient not taking: Reported on 06/09/2017), Disp: 14 tablet, Rfl: 0 .  Norethindrone-Ethinyl Estradiol-Fe Biphas (LO LOESTRIN FE) 1 MG-10 MCG / 10 MCG tablet, Take 1 tablet by mouth daily., Disp: 3 Package, Rfl: 3 Allergies: Patient has no known allergies.  Review of Systems  Constitutional: Negative for chills, fever and  malaise/fatigue.  HENT: Negative for congestion, sinus pain and sore throat.   Eyes: Negative for blurred vision and pain.  Respiratory: Negative for cough and wheezing.   Cardiovascular: Negative for chest pain and leg swelling.  Gastrointestinal: Negative for abdominal pain, constipation, diarrhea, heartburn, nausea and vomiting.  Genitourinary: Negative for dysuria, frequency, hematuria and urgency.  Musculoskeletal: Negative for back pain, joint pain, myalgias and neck pain.  Skin: Negative for itching and rash.  Neurological: Negative for dizziness, tremors and weakness.  Endo/Heme/Allergies: Does not bruise/bleed easily.  Psychiatric/Behavioral: Negative for depression. The patient is not nervous/anxious and does not have insomnia.    Objective: BP 110/60   Ht 5\' 3"  (1.6 m)   Wt 120 lb (54.4 kg)   LMP 06/13/2017   BMI 21.26 kg/m   Filed Weights   07/11/17 0929  Weight: 120 lb (54.4 kg)   Body mass index is 21.26 kg/m. Physical Exam  Constitutional: She is oriented to person, place, and time. She appears well-developed and well-nourished. No distress.  Genitourinary: Rectum normal, vagina normal and uterus normal. Pelvic exam was performed with patient supine. There is no rash or lesion on the right labia. There is no rash or lesion on the left labia. Vagina exhibits no lesion. No bleeding in the vagina. Right adnexum does not display mass and does not display tenderness. Left adnexum does not display mass and  does not display tenderness. Cervix does not exhibit motion tenderness, lesion, friability or polyp.   Uterus is mobile and midaxial. Uterus is not enlarged or exhibiting a mass.  HENT:  Head: Normocephalic and atraumatic. Head is without laceration.  Right Ear: Hearing normal.  Left Ear: Hearing normal.  Nose: No epistaxis.  No foreign bodies.  Mouth/Throat: Uvula is midline, oropharynx is clear and moist and mucous membranes are normal.  Eyes: Pupils are equal, round,  and reactive to light.  Neck: Normal range of motion. Neck supple. No thyromegaly present.  Cardiovascular: Normal rate and regular rhythm. Exam reveals no gallop and no friction rub.  No murmur heard. Pulmonary/Chest: Effort normal and breath sounds normal. No respiratory distress. She has no wheezes. Right breast exhibits no mass, no skin change and no tenderness. Left breast exhibits no mass, no skin change and no tenderness.  Abdominal: Soft. Bowel sounds are normal. She exhibits no distension. There is no tenderness. There is no rebound.  Musculoskeletal: Normal range of motion.  Neurological: She is alert and oriented to person, place, and time. No cranial nerve deficit.  Skin: Skin is warm and dry.  Psychiatric: She has a normal mood and affect. Judgment normal.  Vitals reviewed.  Assessment:  ANNUAL EXAM 1. Screening for cervical cancer   2. Screen for STD (sexually transmitted disease)   3. Annual physical exam    Screening Plan:            1.  Cervical Screening-  Pap smear done today, along w STD screening ([facility], [facility])  2. Counseling for contraception: oral contraceptives (estrogen/progesterone), will change to see if can help w premenstrual sx's.  Prefers pill to patch or ring.  Plans pregnancy soon.    F/U  Return in about 1 year (around 07/11/2018) for Annual.  Barnett Applebaum, MD, [facility], [facility] 07/11/2017  9:47 AM

## 2017-07-11 NOTE — Patient Instructions (Signed)
Ethinyl Estradiol; Norethindrone Acetate tablets (contraception) What is this medicine? ETHINYL ESTRADIOL; NORETHINDRONE ACETATE (ETH in il es tra DYE ole; nor eth IN drone AS e tate) is an oral contraceptive. The products combine two types of female hormones, an estrogen and a progestin. They are used to prevent ovulation and pregnancy. This medicine may be used for other purposes; ask your health care provider or pharmacist if you have questions. COMMON BRAND NAME(S): Laddie Aquas 1.5/30, Junel 1/20, LARIN, Loestrin 1.5/30, Loestrin 1/20, Microgestin 1.5/30, Microgestin 1/20 What should I tell my health care provider before I take this medicine? They need to know if you have or ever had any of these conditions: -abnormal vaginal bleeding -blood vessel disease or blood clots -breast, cervical, endometrial, ovarian, liver, or uterine cancer -diabetes -gallbladder disease -heart disease or recent heart attack -high blood pressure -high cholesterol -kidney disease -liver disease -migraine headaches -stroke -systemic lupus erythematosus (SLE) -tobacco smoker -an unusual or allergic reaction to estrogens, progestins, other medicines, foods, dyes, or preservatives -pregnant or trying to get pregnant -breast-feeding How should I use this medicine? Take this medicine by mouth. To reduce nausea, this medicine may be taken with food. Follow the directions on the prescription label. Take this medicine at the same time each day and in the order directed on the package. Do not take your medicine more often than directed. Contact your pediatrician regarding the use of this medicine in children. Special care may be needed. This medicine has been used in female children who have started having menstrual periods. A patient package insert for the product will be given with each prescription and refill. Read this sheet carefully each time. The sheet may change frequently. Overdosage: If you think you  have taken too much of this medicine contact a poison control center or emergency room at once. NOTE: This medicine is only for you. Do not share this medicine with others. What if I miss a dose? If you miss a dose, refer to the patient information sheet you received with your medicine for direction. If you miss more than one pill, this medicine may not be as effective and you may need to use another form of birth control. What may interact with this medicine? Do not take this medicine with the following medication: -dasabuvir; ombitasvir; paritaprevir; ritonavir -ombitasvir; paritaprevir; ritonavir This medicine may also interact with the following medications: -acetaminophen -antibiotics or medicines for infections, especially rifampin, rifabutin, rifapentine, and griseofulvin, and possibly penicillins or tetracyclines -aprepitant -ascorbic acid (vitamin C) -atorvastatin -barbiturate medicines, such as phenobarbital -bosentan -carbamazepine -caffeine -clofibrate -cyclosporine -dantrolene -doxercalciferol -felbamate -grapefruit juice -hydrocortisone -medicines for anxiety or sleeping problems, such as diazepam or temazepam -medicines for diabetes, including pioglitazone -mineral oil -modafinil -mycophenolate -nefazodone -oxcarbazepine -phenytoin -prednisolone -ritonavir or other medicines for HIV infection or AIDS -rosuvastatin -selegiline -soy isoflavones supplements -St. John's wort -tamoxifen or raloxifene -theophylline -thyroid hormones -topiramate -warfarin This list may not describe all possible interactions. Give your health care provider a list of all the medicines, herbs, non-prescription drugs, or dietary supplements you use. Also tell them if you smoke, drink alcohol, or use illegal drugs. Some items may interact with your medicine. What should I watch for while using this medicine? Visit your doctor or health care professional for regular checks on your  progress. You will need a regular breast and pelvic exam and Pap smear while on this medicine. Use an additional method of contraception during the first cycle that you take these tablets. If you have any  reason to think you are pregnant, stop taking this medicine right away and contact your doctor or health care professional. If you are taking this medicine for hormone related problems, it may take several cycles of use to see improvement in your condition. Smoking increases the risk of getting a blood clot or having a stroke while you are taking birth control pills, especially if you are more than 22 years old. You are strongly advised not to smoke. This medicine can make your body retain fluid, making your fingers, hands, or ankles swell. Your blood pressure can go up. Contact your doctor or health care professional if you feel you are retaining fluid. This medicine can make you more sensitive to the sun. Keep out of the sun. If you cannot avoid being in the sun, wear protective clothing and use sunscreen. Do not use sun lamps or tanning beds/booths. If you wear contact lenses and notice visual changes, or if the lenses begin to feel uncomfortable, consult your eye care specialist. In some women, tenderness, swelling, or minor bleeding of the gums may occur. Notify your dentist if this happens. Brushing and flossing your teeth regularly may help limit this. See your dentist regularly and inform your dentist of the medicines you are taking. If you are going to have elective surgery, you may need to stop taking this medicine before the surgery. Consult your health care professional for advice. This medicine does not protect you against HIV infection (AIDS) or any other sexually transmitted diseases. What side effects may I notice from receiving this medicine? Side effects that you should report to your doctor or health care professional as soon as possible: -breast tissue changes or discharge -changes  in vaginal bleeding during your period or between your periods -chest pain -coughing up blood -dizziness or fainting spells -headaches or migraines -leg, arm or groin pain -severe or sudden headaches -stomach pain (severe) -sudden shortness of breath -sudden loss of coordination, especially on one side of the body -speech problems -symptoms of vaginal infection like itching, irritation or unusual discharge -tenderness in the upper abdomen -vomiting -weakness or numbness in the arms or legs, especially on one side of the body -yellowing of the eyes or skin Side effects that usually do not require medical attention (report to your doctor or health care professional if they continue or are bothersome): -breakthrough bleeding and spotting that continues beyond the 3 initial cycles of pills -breast tenderness -mood changes, anxiety, depression, frustration, anger, or emotional outbursts -increased sensitivity to sun or ultraviolet light -nausea -skin rash, acne, or brown spots on the skin -weight gain (slight) This list may not describe all possible side effects. Call your doctor for medical advice about side effects. You may report side effects to FDA at 1-800-FDA-1088. Where should I keep my medicine? Keep out of the reach of children. Store at room temperature between 15 and 30 degrees C (59 and 86 degrees F). Throw away any unused medicine after the expiration date. NOTE: This sheet is a summary. It may not cover all possible information. If you have questions about this medicine, talk to your doctor, pharmacist, or health care provider.  2018 Elsevier/Gold Standard (2016-01-19 08:02:50)  

## 2017-07-14 LAB — IGP, CTNG, RFX APTIMA HPV ASCU
Chlamydia, Nuc. Acid Amp: NEGATIVE
GONOCOCCUS BY NUCLEIC ACID AMP: NEGATIVE
PAP Smear Comment: 0

## 2017-12-14 ENCOUNTER — Other Ambulatory Visit: Payer: Self-pay | Admitting: Family Medicine

## 2017-12-16 NOTE — Telephone Encounter (Signed)
Patient called and asked about the refill request Flagyl. She says the last time it was prescribed, it was for a yeast infection, so she went to the pharmacy and asked about it and she says they must have requested it. She says I can try something OTC first and if it doesn't help, I will call back for an appointment.

## 2018-02-20 ENCOUNTER — Telehealth: Payer: Self-pay

## 2018-02-20 NOTE — Telephone Encounter (Signed)
Pt aware.

## 2018-02-20 NOTE — Telephone Encounter (Signed)
No hormonal options if trying to conceive. Can try nausea medicine or prevention- take Vitamin B6 twice daily a few days leading up to period; can also consider other Rx meds for this too if needed. Let her know, and let me know if she needs additional meds or discussion

## 2018-02-20 NOTE — Telephone Encounter (Signed)
Pt and hsb have decided to try to conceive.  Pt is on Lo Loestrin FE.  What does she need to do?  [phone]  Adv to finish the pack she is on and have at least one period on her own before trying to conceive.  Pt is nervous about going off Lo Loestrin FE d/t the premenstrual sxs she was having when she was switched to Lo Loestrin FE.  Adv you tend to go back to the way periods were before bc was ever started.  What can she do if she does have these sxs again?  Adv would send msg to [facility].

## 2018-04-25 ENCOUNTER — Telehealth: Payer: Self-pay

## 2018-04-25 NOTE — Telephone Encounter (Signed)
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  What to take?  [phone]  Adv to call the MD who put her on omeprazole and let them know she is preg and what to take or keep taking it and discuss at NOB appt on the 12th.

## 2018-05-01 ENCOUNTER — Telehealth: Payer: Self-pay

## 2018-05-01 ENCOUNTER — Other Ambulatory Visit: Payer: Self-pay | Admitting: Maternal Newborn

## 2018-05-01 DIAGNOSIS — Z369 Encounter for antenatal screening, unspecified: Secondary | ICD-10-CM

## 2018-05-01 NOTE — Telephone Encounter (Signed)
Pt called triage reporting she has her nob apt Thursday, she states she is having light pink discharge that comes and goes, I advised pt this could be normal. Jace put in orders for u/s on Thursday at 12 and pt will f/up with [facility] afterwards for her NOB. Pt aware to call if the pink discharge turns in to bleeding/heavy

## 2018-05-01 NOTE — Progress Notes (Signed)
Ultrasound ordered for spotting in early pregnancy.

## 2018-05-04 ENCOUNTER — Encounter: Payer: Self-pay | Admitting: Certified Nurse Midwife

## 2018-05-04 ENCOUNTER — Ambulatory Visit (INDEPENDENT_AMBULATORY_CARE_PROVIDER_SITE_OTHER): Payer: PRIVATE HEALTH INSURANCE

## 2018-05-04 ENCOUNTER — Ambulatory Visit (INDEPENDENT_AMBULATORY_CARE_PROVIDER_SITE_OTHER): Payer: PRIVATE HEALTH INSURANCE | Admitting: Certified Nurse Midwife

## 2018-05-04 VITALS — BP 110/50 | Ht 63.0 in | Wt 125.0 lb

## 2018-05-04 DIAGNOSIS — Z3687 Encounter for antenatal screening for uncertain dates: Secondary | ICD-10-CM | POA: Diagnosis not present

## 2018-05-04 DIAGNOSIS — Z3401 Encounter for supervision of normal first pregnancy, first trimester: Secondary | ICD-10-CM

## 2018-05-04 DIAGNOSIS — Z369 Encounter for antenatal screening, unspecified: Secondary | ICD-10-CM

## 2018-05-04 DIAGNOSIS — O26851 Spotting complicating pregnancy, first trimester: Secondary | ICD-10-CM

## 2018-05-04 DIAGNOSIS — Z3A01 Less than 8 weeks gestation of pregnancy: Secondary | ICD-10-CM

## 2018-05-04 LAB — POCT URINALYSIS DIPSTICK OB
Glucose, UA: NEGATIVE
PROTEIN: NEGATIVE

## 2018-05-04 LAB — POCT WET PREP (WET MOUNT): Trichomonas Wet Prep HPF POC: ABSENT

## 2018-05-04 MED ORDER — TERCONAZOLE 80 MG VA SUPP: 80.0000 mg | Freq: Every day | VAGINAL | 0 refills | Status: DC

## 2018-05-04 NOTE — Progress Notes (Signed)
NOB H&P EDC=12/30/2018 by LMP and ultrasound today for spotting CRL 5wk5d, FCA seen but unable to document with M mode NOB labs today May transfer care to [facility] in future + monilial infection-Terazol 3 suppositories Viability scan and ROB in 10-14 days Dalia Heading, CNM

## 2018-05-04 NOTE — Progress Notes (Signed)
NOB- has questions about taking omeprazole, had an u/s earlier today due to spotting that started since last friday

## 2018-05-04 NOTE — Progress Notes (Signed)
New Obstetric Patient H&P    Chief Complaint: "Desires prenatal care"   History of Present Illness: Patient is a 22 y.o. G47P0000 White female, LMP 03/25/2018 presents with amenorrhea and positive home pregnancy test. Based on her  LMP, her EDD is 12/30/2018 and her EGA is 5wk5d. Her menstrual cycles on OCPs was regular and light lasting 3 days. Her LMP was a withdrawal bleed when she stopped her pills. She did not have a menses after that. She had a positive UPT on 23 Apr 2018. An ultrasound today reveals a SIUP. CRL was 2 mm c/w 5wk5d gestation. FCA was seen but unable to be picked up with M mode. Her last pap smear was 10 months ago and was negative.    Since her LMP she claims she has experienced breast tenderness, nausea after eating and vaginal spotting with cramping. Her spotting has been intermittent since 6 Dec. She denies any vulvovaginal itching or irritation. Her past medical history is remarkable for silent reflux which caused vocal nodules. She was taking omeprazole until her +UPT. Now takes antacids instead. She also had pyloroplasty at 69 weeks of age for pyloric stenosis.    Since her LMP, she admits to the use of tobacco products  No Since her LMP, she admits to use of alcohol: Yes, in early November. None since +UPT Since her LMP, she admits to the use of illicit drugs: Yes, MJ, but none since positive UPT. She claims she has gained   no pounds since the start of her pregnancy.  There are cats in the home in the home  yes If yes Indoor She admits close contact with children on a regular basis  no  She has had chicken pox in the past no She has had Tuberculosis exposures, symptoms, or previously tested positive for TB   no Current or past history of domestic violence. no  Genetic Screening/Teratology Counseling: (Includes patient, baby's father, or anyone in either family with:)   82. Patient's age >/= 12 at [facility]  no 2. Thalassemia ([place], [place], [place], or Asian  background): MCV<80  no 3. Neural tube defect (meningomyelocele, spina bifida, anencephaly)  no 4. Congenital heart defect  no  5. Down syndrome  no 6. Tay-Sachs (Jewish, [place])  no 7. Canavan's Disease  no 8. Sickle cell disease or trait (African)  no  9. Hemophilia or other blood disorders  no  10. Muscular dystrophy  no  11. Cystic fibrosis  no  12. Huntington's Chorea  no  13. Mental retardation/autism  no 14. Other inherited genetic or chromosomal disorder  no 15. Maternal metabolic disorder (DM, PKU, etc)  no 16. Patient or FOB with a child with a birth defect not listed above no  16a. Patient or FOB with a birth defect themselves yes, patient had pyloric stenosis 17. Recurrent pregnancy loss, or stillbirth  no  18. Any medications since LMP other than prenatal vitamins (include vitamins, supplements, OTC meds, drugs, alcohol)  Yes, omeprazole, Tums 19. Any other genetic/environmental exposure to discuss  no  Infection History:   1. Lives with someone with TB or TB exposed  no  2. Patient or partner has history of genital herpes  no 3. Rash or viral illness since LMP  no 4. History of STI (GC, CT, HPV, syphilis, HIV)  no 5. History of recent travel :  Yes, [place] and [place]  Other pertinent information:  Yes, husband Ovid Curd is 33 year old and has  asthma.    Review of Systems:10 point review of systems negative unless otherwise noted in HPI  Past Medical History:  Past Medical History:  Diagnosis Date  . Hematuria   . 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 Urinary frequency     Past Surgical History:  Past Surgical History:  Procedure Laterality Date  . PYLOROPLASTY  at 30 weeks of age    Gynecologic History: Patient's last menstrual period was 03/25/2018.  Obstetric History: G0P0000  Family History:  Family History  Problem Relation Age of Onset  . Diabetes Maternal Grandmother 63       Type 1  . Parkinson's disease Maternal Grandfather   .  Breast cancer Maternal Aunt 50       Malignant  . Heart disease Maternal Uncle   . Other Cousin        had twins    Social History:  Social History   Socioeconomic History  . Marital status: Single    Spouse name: Ovid Curd  . Number of children: Not on file  . Years of education: Not on file  . Highest education level: Not on file  Occupational History  . Occupation: Data processing manager  Social Needs  . Financial resource strain: Not on file  . Food insecurity:    Worry: Not on file    Inability: Not on file  . Transportation needs:    Medical: Not on file    Non-medical: Not on file  Tobacco Use  . Smoking status: Former Smoker    Packs/day: 0.01    Types: Cigarettes  . Smokeless tobacco: Never Used  . Tobacco comment: Social Smoker (Occasionally)  Substance and Sexual Activity  . Alcohol use: Not Currently    Alcohol/week: 0.0 standard drinks    Comment: Socially   . Drug use: Not Currently    Types: Marijuana  . Sexual activity: Yes    Partners: Male    Birth control/protection: None  Lifestyle  . Physical activity:    Days per week: Not on file    Minutes per session: Not on file  . Stress: Not on file  Relationships  . Social connections:    Talks on phone: Not on file    Gets together: Not on file    Attends religious service: Not on file    Active member of club or organization: Not on file    Attends meetings of clubs or organizations: Not on file    Relationship status: Not on file  . Intimate partner violence:    Fear of current or ex partner: Not on file    Emotionally abused: Not on file    Physically abused: Not on file    Forced sexual activity: Not on file  Other Topics Concern  . Not on file  Social History Narrative  . Not on file    Allergies:  Allergies  Allergen Reactions  . Nickel     Medications:  Current Outpatient Medications on File Prior to Visit  Medication Sig Dispense Refill  . Prenatal Vit-Fe Fumarate-FA  (MULTIVITAMIN-PRENATAL) 27-0.8 MG TABS tablet Take 1 tablet by mouth daily at 12 noon.     No current facility-administered medications on file prior to visit.     Physical Exam Vitals: BP (!) 110/50   Wt 125 lb (56.7 kg)   LMP 03/25/2018   BMI 22.14 kg/m   General: WF in NAD HEENT: normocephalic, anicteric Thyroid: no enlargement, no palpable nodules Pulmonary: No increased work  of breathing, CTAB Breasts:Soft, tender, no masses, everted nipples Cardiovascular: RRR, without murmur Abdomen: soft, non-tender, non-distended.  Umbilicus without lesions.  No hepatomegaly or masses palpable. No evidence of hernia  Genitourinary:  External: Normal external female genitalia.  Normal urethral meatus, normal Bartholin's and Skene's glands.    Vagina: Normal vaginal mucosa, white cottage cheese discharge.    Cervix: Grossly normal in appearance, no bleeding, closed  Uterus: RV, Non-enlarged, mobile, normal contour.   Adnexa: ovaries non-enlarged, no adnexal masses  Rectal: deferred Extremities: no edema, erythema, or tenderness Neurologic: Grossly intact Psychiatric: mood appropriate, affect full  Results for orders placed or performed in visit on 05/04/18 (from the past 24 hour(s))  POC Urinalysis Dipstick OB     Status: Normal   Collection Time: 05/04/18  3:27 PM  Result Value Ref Range   Color, UA     Clarity, UA     Glucose, UA Negative Negative   Bilirubin, UA     Ketones, UA     Spec Grav, UA     Blood, UA     pH, UA     POC,PROTEIN,UA Negative Negative, Trace, Small (1+), Moderate (2+), Large (3+), 4+   Urobilinogen, UA     Nitrite, UA     Leukocytes, UA     Appearance     Odor    POCT Wet Prep [facility] [place])     Status: Abnormal   Collection Time: 05/04/18  9:44 PM  Result Value Ref Range   Source Wet Prep POC vagina    WBC, Wet Prep HPF POC     Bacteria Wet Prep HPF POC     BACTERIA WET PREP MORPHOLOGY POC     Clue Cells Wet Prep HPF POC None None   Clue Cells Wet  Prep Whiff POC     Yeast Wet Prep HPF POC Moderate (A) None   KOH Wet Prep POC     Trichomonas Wet Prep HPF POC Absent Absent   Assessment: 22 y.o. G1P0000 at Unknown presenting to initiate prenatal care at 5wk5d Monilial vaginitis Spotting in pregnancy possibly due to monilial infection  Plan: 1) Avoid alcoholic beverages. 2) Patient encouraged not to use marijuana  3) Discontinue the use of all non-medicinal drugs and chemicals.  4) Take prenatal vitamins daily.  5) Nutrition, food safety (fish, cheese advisories, and high nitrite foods) and exercise discussed. 6) Hospital and practice style discussed with cross coverage system. Patinet may want to deliver in [place]. Discussed transfer of care at some point in her pregnancy if she desires to deliver at [facility].  7) Genetic Screening, such as with 1st Trimester Screening, cell free fetal DNA, AFP testing, and Ultrasound, is discussed with patient. At the conclusion of today's visit patient is undecided about genetic testing. Also discussed screening for recessive mutations. 8) Patient is asked about travel to areas at risk for the Zika virus, and counseled to avoid travel and exposure to mosquitoes or sexual partners who may have themselves been exposed to the virus. Testing is discussed, and will be ordered as appropriate.  9)Terazol 3 suppositories qhs x 3 nights 10) ROB/ viability scan in 10-14 days  Dalia Heading, CNM

## 2018-05-05 LAB — RPR+RH+ABO+RUB AB+AB SCR+CB...
ANTIBODY SCREEN: NEGATIVE
HIV Screen 4th Generation wRfx: NONREACTIVE
Hematocrit: 37.7 % (ref 34.0–46.6)
Hemoglobin: 12.7 g/dL (ref 11.1–15.9)
Hepatitis B Surface Ag: NEGATIVE
MCH: 27.7 pg (ref 26.6–33.0)
MCHC: 33.7 g/dL (ref 31.5–35.7)
MCV: 82 fL (ref 79–97)
PLATELETS: 357 10*3/uL (ref 150–450)
RBC: 4.59 x10E6/uL (ref 3.77–5.28)
RDW: 18.5 % — AB (ref 12.3–15.4)
RPR Ser Ql: NONREACTIVE
Rh Factor: POSITIVE
Rubella Antibodies, IGG: 1.31 index (ref >0.99)
WBC: 7.9 10*3/uL (ref 3.4–10.8)

## 2018-05-08 ENCOUNTER — Telehealth: Payer: Self-pay

## 2018-05-08 NOTE — Telephone Encounter (Signed)
Pt called after hour nurse 05/05/18 at 7:57 stating rx for med was too expensive.  Adv otc med okay to use and 05/07/18 at 9:28 am c/o seeing pink after inserting otc yeast medication - monistat.  She used it two nights but didn't use it last night b/c she was afraid she irritating herself more.  States she feels like yeast inf is gone today.  Only see pink when she wipes.  Adv tissues are probably irritated from the inf.  To monitor and if becomes worse or starts bleeding like a period to be seen.

## 2018-05-09 LAB — URINE CULTURE

## 2018-05-09 LAB — CHLAMYDIA/GONOCOCCUS/TRICHOMONAS, NAA
Chlamydia by NAA: NEGATIVE
Gonococcus by NAA: NEGATIVE
Trich vag by NAA: NEGATIVE

## 2018-05-09 NOTE — Telephone Encounter (Signed)
Pt calling back today c/o issues c spotting; 6wks; when she went to the bathroom  It was clotty and darker and it's getting worse.  [phone] Pt states she hasn't seen much since.  She has some pressure in her back and some light cramping.  Adv she can go to ED if she'd like.  Pt inquired about Urgent Care - if they had u/s.  Adv not sure u/s would be able to tell much at 6wks.  Pt asked if we had u/s openings tomorrow.  Adv it looked like we did.  Offered to tx to front desk to be scheduled for tomorrow.  Pt opts to think about it for a few minutes and talk to hsb and will call back if desires.  Otherwise, she's going to wait it out.

## 2018-05-15 ENCOUNTER — Encounter: Payer: Self-pay | Admitting: Obstetrics and Gynecology

## 2018-05-15 ENCOUNTER — Ambulatory Visit (INDEPENDENT_AMBULATORY_CARE_PROVIDER_SITE_OTHER): Payer: PRIVATE HEALTH INSURANCE

## 2018-05-15 ENCOUNTER — Ambulatory Visit (INDEPENDENT_AMBULATORY_CARE_PROVIDER_SITE_OTHER): Payer: PRIVATE HEALTH INSURANCE | Admitting: Obstetrics and Gynecology

## 2018-05-15 ENCOUNTER — Other Ambulatory Visit: Payer: Self-pay | Admitting: Certified Nurse Midwife

## 2018-05-15 VITALS — BP 100/60 | Wt 126.0 lb

## 2018-05-15 DIAGNOSIS — Z3A01 Less than 8 weeks gestation of pregnancy: Secondary | ICD-10-CM

## 2018-05-15 DIAGNOSIS — Z3401 Encounter for supervision of normal first pregnancy, first trimester: Secondary | ICD-10-CM

## 2018-05-15 DIAGNOSIS — Z3400 Encounter for supervision of normal first pregnancy, unspecified trimester: Secondary | ICD-10-CM | POA: Insufficient documentation

## 2018-05-15 DIAGNOSIS — O26851 Spotting complicating pregnancy, first trimester: Secondary | ICD-10-CM

## 2018-05-15 LAB — POCT URINALYSIS DIPSTICK OB
GLUCOSE, UA: NEGATIVE
POC,PROTEIN,UA: NEGATIVE

## 2018-05-15 NOTE — Progress Notes (Signed)
Routine Prenatal Care Visit  Subjective  Sherry Welch is a 22 y.o. G1P0000 at [redacted]w[redacted]d being seen today for ongoing prenatal care.  She is currently monitored for the following issues for this low-risk pregnancy and has Iron deficiency anemia; Thrombocytosis ([place]); and Supervision of normal first pregnancy on their problem list.  ----------------------------------------------------------------------------------- Patient reports no complaints.  No more vaginal bleeding.   . Vag. Bleeding: None.   . Denies leaking of fluid.  U/S today with no issues (small cyst noted in umbilical cord) ----------------------------------------------------------------------------------- The following portions of the patient's history were reviewed and updated as appropriate: allergies, current medications, past family history, past medical history, past social history, past surgical history and problem list. Problem list updated.  Objective  Blood pressure 100/60, weight 126 lb (57.2 kg), last menstrual period 03/25/2018. Pregravid weight 125 lb (56.7 kg) Total Weight Gain 1 lb (0.454 kg) Urinalysis: Urine Protein    Urine Glucose    Fetal Status: Fetal Heart Rate (bpm): present         General:  Alert, oriented and cooperative. Patient is in no acute distress.  Skin: Skin is warm and dry. No rash noted.   Cardiovascular: Normal heart rate noted  Respiratory: Normal respiratory effort, no problems with respiration noted  Abdomen: Soft, gravid, appropriate for gestational age.       Pelvic:  Cervical exam deferred        Extremities: Normal range of motion.     Mental Status: Normal mood and affect. Normal behavior. Normal judgment and thought content.   Imaging Results: US Ob Transvaginal  Result Date: 05/15/2018 Patient Name: Sherry Welch DOB: December 07, 1995 MRN: [id number] ULTRASOUND REPORT Location: [facility] Date of Service: 05/15/2018 Indications: Follow up viability. Findings: Singleton  intrauterine pregnancy is visualized with a CRL consistent with [redacted]w[redacted]d gestation, giving an (U/S) EDD of 12/30/2018. The (U/S) EDD is consistent with the first ultrasound EDD. FHR: 157 BPM CRL measurement: 11.5 mm Yolk sac is visualized and appears normal. Possible umbilical cord cyst seen. Amnion: visualized and appears normal Right Ovary is normal in appearance. Left Ovary is not seen. Survey of the adnexa demonstrates no adnexal masses. There is no free peritoneal fluid in the cul de sac. Impression: 1. [redacted]w[redacted]d Viable Singleton Intrauterine pregnancy by U/S. 2. (U/S) EDD is consistent with first ultrasound EDD of 12/30/2018. 3. Possible small umbilical cord cyst seen. Recommendations: 1.Clinical correlation with the patient's History and Physical Exam. 2. Follow up as needed Vita Barley, RDMS RVT There is a viable singleton gestation.  Detailed evaluation of the fetal anatomy is precluded by early gestational age.  It must be noted that a normal ultrasound particular at this early gestational age is unable to rule out fetal aneuploidy, risk of first trimester miscarriage, or anatomic birth defects. Prentice Docker, MD, [facility], [facility] 05/15/2018 9:05 AM     Assessment   22 y.o. G1P0000 at [redacted]w[redacted]d by  12/30/2018, by Last Menstrual Period presenting for routine prenatal visit  Plan   FIRST Problems (from 05/04/18 to present)    Problem Noted Resolved   Supervision of normal first pregnancy 05/15/2018 by Will Bonnet, MD No      Preterm labor symptoms and general obstetric precautions including but not limited to vaginal bleeding, contractions, leaking of fluid and fetal movement were reviewed in detail with the patient. Please refer to After Visit Summary for other counseling recommendations.   Return in about 4 weeks (around 06/12/2018) for  Routine Prenatal Appointment.  Prentice Docker, MD, [facility], [facility] 05/15/2018 9:19 AM

## 2018-05-30 ENCOUNTER — Encounter: Payer: PRIVATE HEALTH INSURANCE | Admitting: Maternal Newborn

## 2018-06-12 ENCOUNTER — Encounter: Payer: Self-pay | Admitting: Obstetrics and Gynecology

## 2018-06-12 ENCOUNTER — Ambulatory Visit (INDEPENDENT_AMBULATORY_CARE_PROVIDER_SITE_OTHER): Payer: BLUE CROSS/BLUE SHIELD | Admitting: Obstetrics and Gynecology

## 2018-06-12 VITALS — BP 102/64 | Wt 125.0 lb

## 2018-06-12 DIAGNOSIS — Z3A11 11 weeks gestation of pregnancy: Secondary | ICD-10-CM

## 2018-06-12 DIAGNOSIS — Z3401 Encounter for supervision of normal first pregnancy, first trimester: Secondary | ICD-10-CM

## 2018-06-12 NOTE — Progress Notes (Signed)
  Routine Prenatal Care Visit  Subjective  Sherry Welch is a 23 y.o. G1P0000 at [redacted]w[redacted]d being seen today for ongoing prenatal care.  She is currently monitored for the following issues for this low-risk pregnancy and has Iron deficiency anemia; Thrombocytosis ([place]); and Supervision of normal first pregnancy on their problem list.  ----------------------------------------------------------------------------------- Patient reports no complaints.  She did note for a few days dark brown spots when wiping. None in past 12 days. No cramping.   . Vag. Bleeding: None.   . Denies leaking of fluid.  ----------------------------------------------------------------------------------- The following portions of the patient's history were reviewed and updated as appropriate: allergies, current medications, past family history, past medical history, past social history, past surgical history and problem list. Problem list updated.   Objective  Blood pressure 102/64, weight 125 lb (56.7 kg), last menstrual period 03/25/2018. Pregravid weight 125 lb (56.7 kg) Total Weight Gain 0 lb (0 kg) Urinalysis: Urine Protein    Urine Glucose    Fetal Status: Fetal Heart Rate (bpm): present         General:  Alert, oriented and cooperative. Patient is in no acute distress.  Skin: Skin is warm and dry. No rash noted.   Cardiovascular: Normal heart rate noted  Respiratory: Normal respiratory effort, no problems with respiration noted  Abdomen: Soft, gravid, appropriate for gestational age.       Pelvic:  Cervical exam deferred        Extremities: Normal range of motion.  Edema: None  Mental Status: Normal mood and affect. Normal behavior. Normal judgment and thought content.   Assessment   23 y.o. G1P0000 at [redacted]w[redacted]d by  12/30/2018, by Last Menstrual Period presenting for routine prenatal visit  Plan   FIRST Problems (from 05/04/18 to present)    Problem Noted Resolved   Supervision of normal first pregnancy  05/15/2018 by Will Bonnet, MD No   Overview Signed 05/30/2018  5:23 PM by Dalia Heading, [place] Prenatal Labs  Dating LMP=5 Blood type:   O positive  Genetic Screen 1 Screen: declines    AFP:     Quad:     NIPS: Antibody: negative  Anatomic [place]  Rubella:   Immune Varicella: nonimmune  GTT Early:               Third trimester:  RPR:   non reactive  Rhogam  HBsAg:   negative  TDaP vaccine                       Flu Shot: HIV:   negative  Baby Food                                GBS:   Contraception  Pap:  CBB     CS/VBAC    Support Person             Preterm labor symptoms and general obstetric precautions including but not limited to vaginal bleeding, contractions, leaking of fluid and fetal movement were reviewed in detail with the patient. Please refer to After Visit Summary for other counseling recommendations.   Return in about 4 weeks (around 07/10/2018) for Routine Prenatal Appointment.  Prentice Docker, MD, [facility], [facility] 06/12/2018 3:30 PM

## 2018-06-23 IMAGING — US US PELVIS COMPLETE
1 series · 14 of 25 positions shown · non-contrast
Comparison: None

CLINICAL DATA: Pelvic pain.  Dysfunctional bleeding for 1 month.

EXAM:
TRANSABDOMINAL AND TRANSVAGINAL ULTRASOUND OF PELVIS
TECHNIQUE: Both transabdominal and transvaginal ultrasound examinations of the
pelvis were performed. Transabdominal technique was performed for
global imaging of the pelvis including uterus, ovaries, adnexal
regions, and pelvic cul-de-sac. It was necessary to proceed with
endovaginal exam following the transabdominal exam to visualize the
endometrium and ovaries.

[Series 1: us pelvis complete · 0.20mm/px · 14 of 53 slices shown]
[im 1/53]
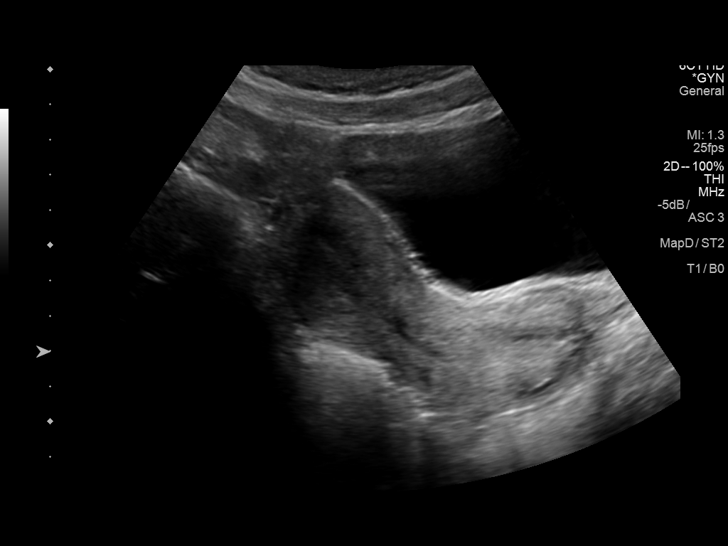
[im 5/53]
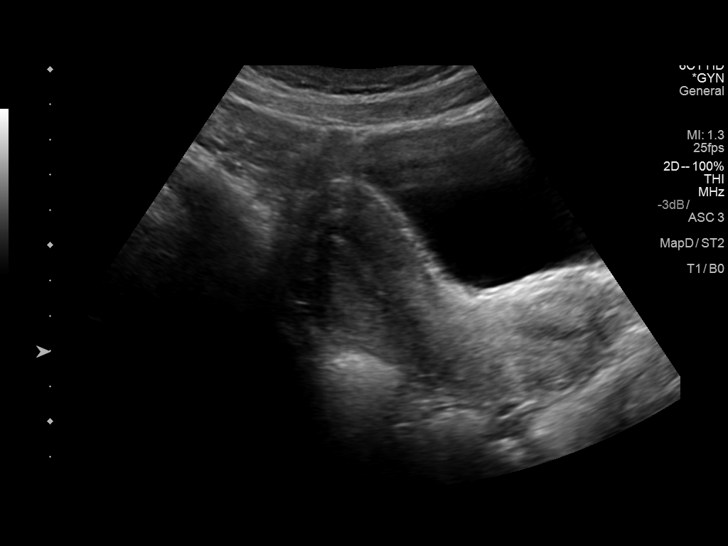
[im 9/53]
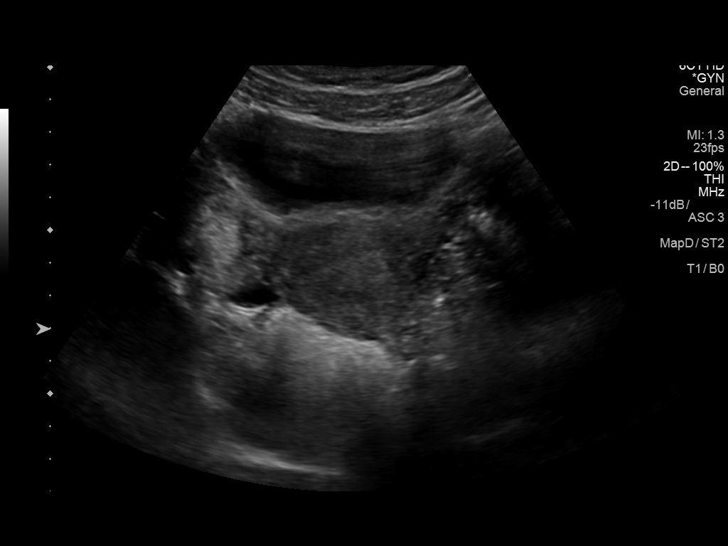
[im 14/53]
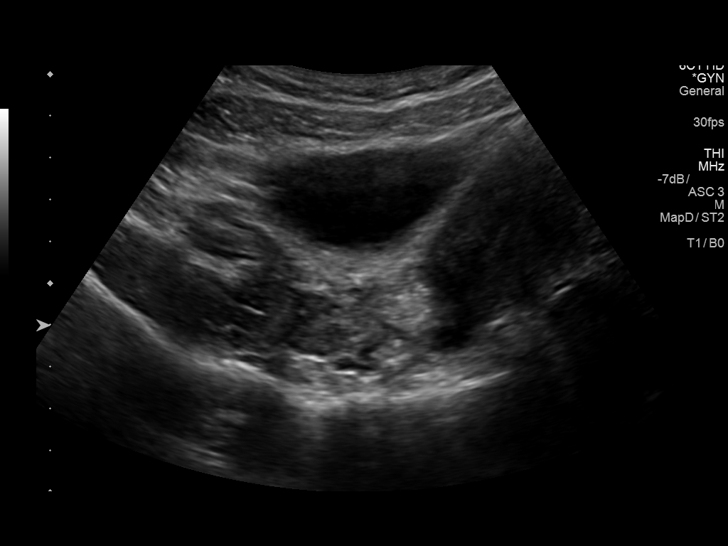
[im 18/53]
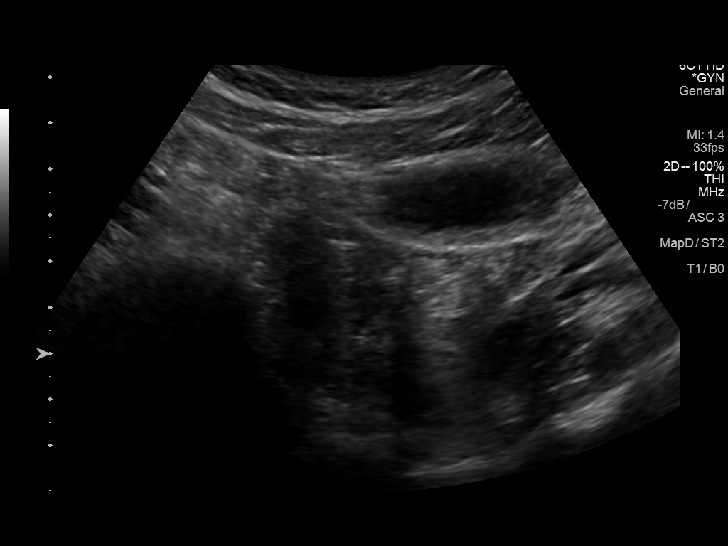
[im 20/53]
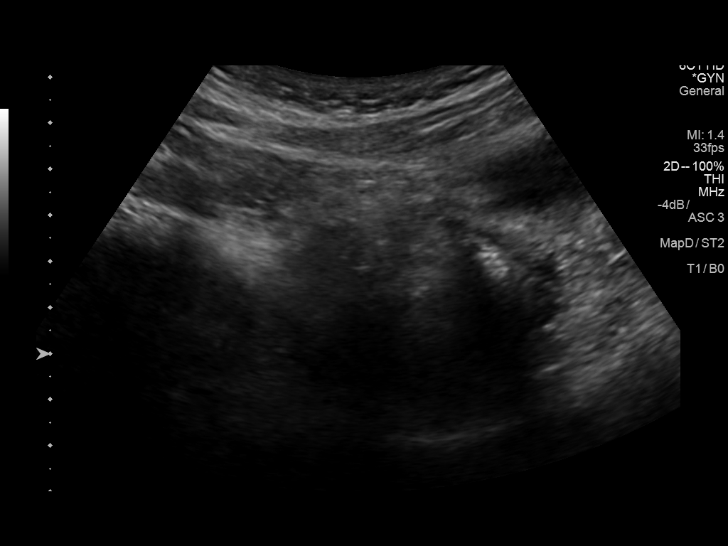
[im 24/53]
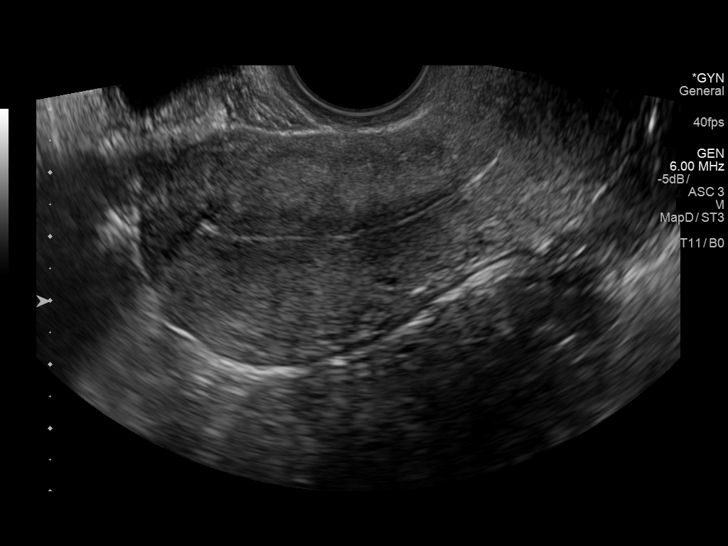
[im 29/53]
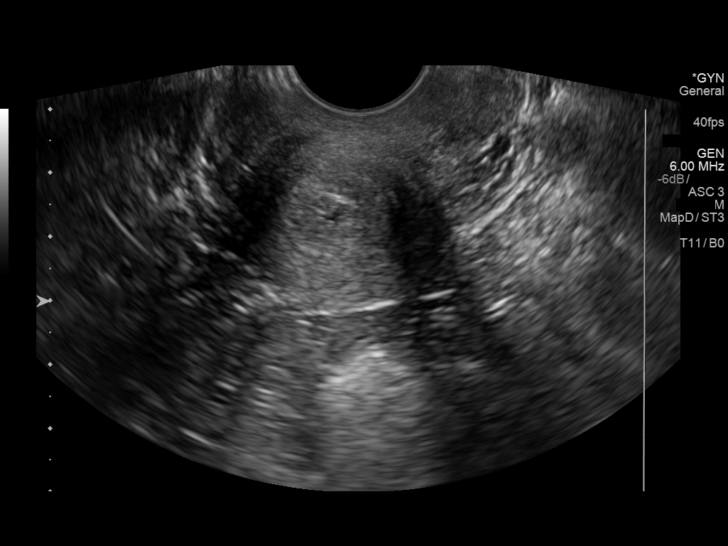
[im 33/53]
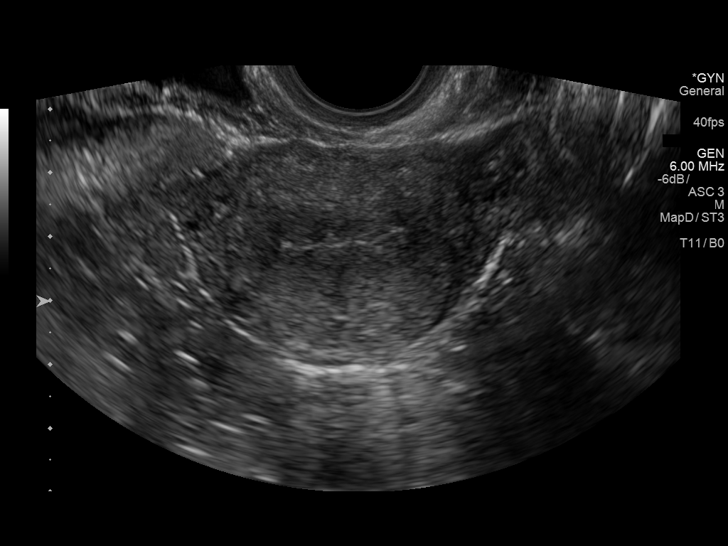
[im 35/53]
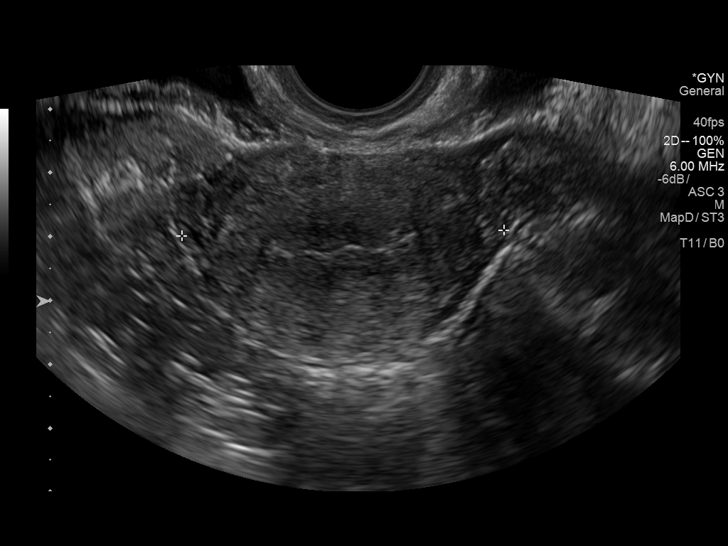
[im 40/53]
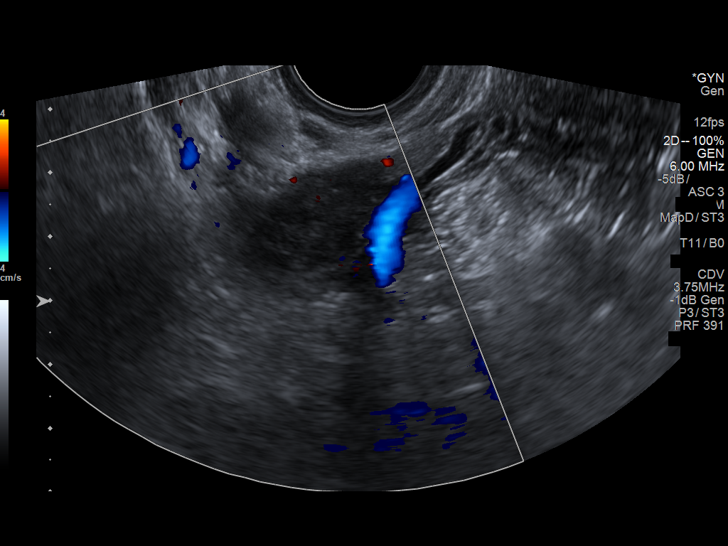
[im 44/53]
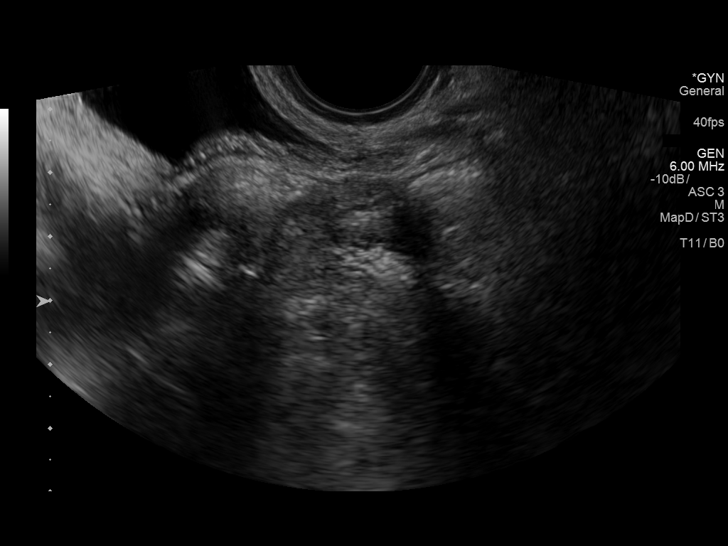
[im 48/53]
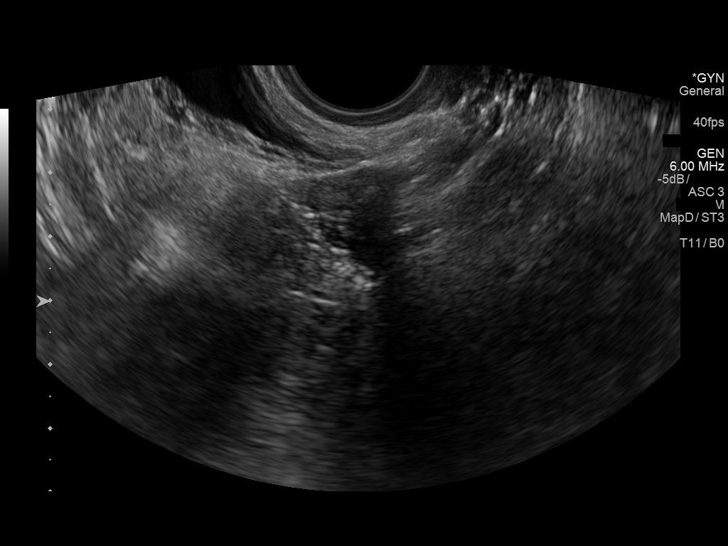
[im 53/53]
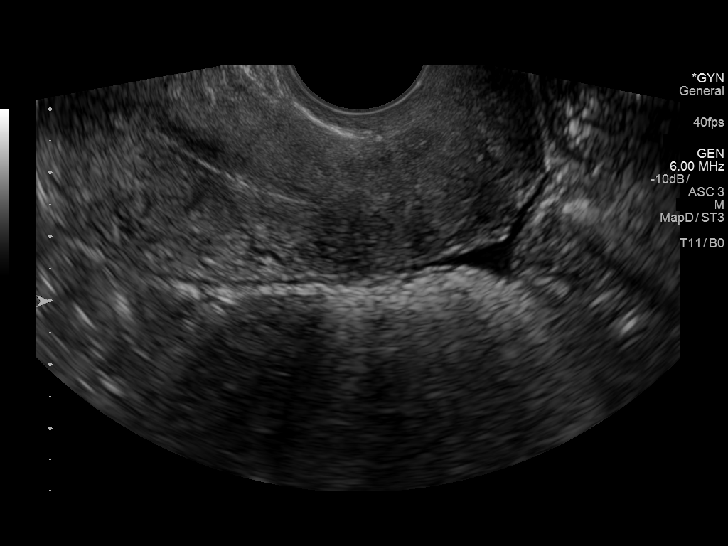

[14 of 25 positions shown; findings below may reference images not displayed]

FINDINGS: Uterus

Measurements: 9.6 x 3.8 x 5.4 cm. No fibroids or other mass
visualized.

Endometrium

Thickness: 3.3 mm.  No focal abnormality visualized.

Right ovary

Measurements: 1.8 x 1.0 x 2.8 cm. Normal appearance/no adnexal mass.

Left ovary

Measurements: 1.4 x 1.3 x 1.9 cm. Normal appearance/no adnexal mass.

Other findings

Trace fluid in the cul-de-sac is likely physiologic.
IMPRESSION: 1. Normal study.  No cause for the patient's pain identified.

## 2018-07-10 ENCOUNTER — Ambulatory Visit (INDEPENDENT_AMBULATORY_CARE_PROVIDER_SITE_OTHER): Payer: BLUE CROSS/BLUE SHIELD | Admitting: Obstetrics & Gynecology

## 2018-07-10 VITALS — BP 100/60 | Wt 126.0 lb

## 2018-07-10 DIAGNOSIS — Z3A15 15 weeks gestation of pregnancy: Secondary | ICD-10-CM

## 2018-07-10 DIAGNOSIS — Z3402 Encounter for supervision of normal first pregnancy, second trimester: Secondary | ICD-10-CM

## 2018-07-10 DIAGNOSIS — Z369 Encounter for antenatal screening, unspecified: Secondary | ICD-10-CM

## 2018-07-10 LAB — POCT URINALYSIS DIPSTICK OB
GLUCOSE, UA: NEGATIVE
POC,PROTEIN,UA: NEGATIVE

## 2018-07-10 NOTE — Patient Instructions (Signed)

## 2018-07-10 NOTE — Progress Notes (Signed)
  Subjective  Fetal Movement? no Contractions? no Leaking Fluid? no Vaginal Bleeding? no NAUSEA? No Objective  BP 100/60   Wt 126 lb (57.2 kg)   LMP 03/25/2018   BMI 22.32 kg/m  General: NAD Pumonary: no increased work of breathing Abdomen: gravid, non-tender Extremities: no edema Psychiatric: mood appropriate, affect full  Assessment  23 y.o. G1P0000 at [redacted]w[redacted]d by  12/30/2018, by Last Menstrual Period presenting for routine prenatal visit  Plan   Problem List Items Addressed This Visit    Supervision of normal first pregnancy   [redacted] weeks gestation of pregnancy        PNV   Encounter for fetal ultrasound next visit      Relevant Orders   US OB Comp + [place], MD, [facility], [facility] 07/10/2018  3:09 PM

## 2018-07-25 ENCOUNTER — Other Ambulatory Visit: Payer: Self-pay | Admitting: Obstetrics & Gynecology

## 2018-07-25 DIAGNOSIS — Z369 Encounter for antenatal screening, unspecified: Secondary | ICD-10-CM

## 2018-08-04 ENCOUNTER — Ambulatory Visit: Payer: PRIVATE HEALTH INSURANCE

## 2018-08-07 ENCOUNTER — Ambulatory Visit: Payer: BLUE CROSS/BLUE SHIELD

## 2018-08-07 ENCOUNTER — Encounter: Payer: BLUE CROSS/BLUE SHIELD | Admitting: Obstetrics and Gynecology

## 2018-08-10 ENCOUNTER — Encounter: Payer: Self-pay | Admitting: Obstetrics and Gynecology

## 2018-08-10 ENCOUNTER — Ambulatory Visit (INDEPENDENT_AMBULATORY_CARE_PROVIDER_SITE_OTHER): Payer: BLUE CROSS/BLUE SHIELD

## 2018-08-10 ENCOUNTER — Other Ambulatory Visit: Payer: Self-pay

## 2018-08-10 ENCOUNTER — Ambulatory Visit (INDEPENDENT_AMBULATORY_CARE_PROVIDER_SITE_OTHER): Payer: BLUE CROSS/BLUE SHIELD | Admitting: Obstetrics and Gynecology

## 2018-08-10 VITALS — BP 110/64 | Wt 126.0 lb

## 2018-08-10 DIAGNOSIS — D473 Essential (hemorrhagic) thrombocythemia: Secondary | ICD-10-CM

## 2018-08-10 DIAGNOSIS — Z3402 Encounter for supervision of normal first pregnancy, second trimester: Secondary | ICD-10-CM

## 2018-08-10 DIAGNOSIS — Z3A19 19 weeks gestation of pregnancy: Secondary | ICD-10-CM

## 2018-08-10 DIAGNOSIS — Z363 Encounter for antenatal screening for malformations: Secondary | ICD-10-CM

## 2018-08-10 DIAGNOSIS — D75839 Thrombocytosis, unspecified: Secondary | ICD-10-CM

## 2018-08-10 NOTE — Patient Instructions (Signed)
Coronavirus (COVID-19) Are you at risk?  Are you at risk for the Coronavirus (COVID-19)?  To be considered HIGH RISK for Coronavirus (COVID-19), you have to meet the following criteria:  . Traveled to [place], [place], [place], [place] or [place]; or in the [place] to [place], [place], [place], or [place]; and have fever, cough, and shortness of breath within the last 2 weeks of travel OR . Been in close contact with a person diagnosed with COVID-19 within the last 2 weeks and have fever, cough, and shortness of breath . IF YOU DO NOT MEET THESE CRITERIA, YOU ARE CONSIDERED LOW RISK FOR COVID-19.  What to do if you are HIGH RISK for COVID-19?  . If you are having a medical emergency, call 911. . Seek medical care right away. Before you go to a doctor's office, urgent care or emergency department, call ahead and tell them about your recent travel, contact with someone diagnosed with COVID-19, and your symptoms. You should receive instructions from your physician's office regarding next steps of care.  . When you arrive at healthcare provider, tell the healthcare staff immediately you have returned from visiting [place], [place], [place], [place] or [place]; or traveled in the [place] to [place], [place], [place], or [place]; in the last two weeks or you have been in close contact with a person diagnosed with COVID-19 in the last 2 weeks.   . Tell the health care staff about your symptoms: fever, cough and shortness of breath. . After you have been seen by a medical provider, you will be either: o Tested for (COVID-19) and discharged home on quarantine except to seek medical care if symptoms worsen, and asked to  - Stay home and avoid contact with others until you get your results (4-5 days)  - Avoid travel on public transportation if possible (such as bus, train, or airplane) or o Sent to the Emergency Department by EMS for evaluation, COVID-19 testing, and possible  admission depending on your condition and test results.  What to do if you are LOW RISK for COVID-19?  Reduce your risk of any infection by using the same precautions used for avoiding the common cold or flu:  . Wash your hands often with soap and warm water for at least 20 seconds.  If soap and water are not readily available, use an alcohol-based hand sanitizer with at least 60% alcohol.  . If coughing or sneezing, cover your mouth and nose by coughing or sneezing into the elbow areas of your shirt or coat, into a tissue or into your sleeve (not your hands). . Avoid shaking hands with others and consider head nods or verbal greetings only. . Avoid touching your eyes, nose, or mouth with unwashed hands.  . Avoid close contact with people who are sick. . Avoid places or events with large numbers of people in one location, like concerts or sporting events. . Carefully consider travel plans you have or are making. . If you are planning any travel outside or inside the [place], visit the CDC's Travelers' Health webpage for the latest health notices. . If you have some symptoms but not all symptoms, continue to monitor at home and seek medical attention if your symptoms worsen. . If you are having a medical emergency, call 911.   ADDITIONAL HEALTHCARE OPTIONS FOR PATIENTS  [facility] / e-Visit: [email or web address]         [facility]: [phone]  [facility]: [phone]                   [facility]: [phone]   

## 2018-08-10 NOTE — Progress Notes (Signed)
Routine Prenatal Care Visit  Subjective  Sherry Welch is a 23 y.o. G1P0000 at [redacted]w[redacted]d being seen today for ongoing prenatal care.  She is currently monitored for the following issues for this low-risk pregnancy and has Iron deficiency anemia; Thrombocytosis ([place]); and Supervision of normal first pregnancy on their problem list.  ----------------------------------------------------------------------------------- Patient reports no complaints.   Contractions: Not present. Vag. Bleeding: None.  Movement: Absent. Denies leaking of fluid.  U/S complete today. ----------------------------------------------------------------------------------- The following portions of the patient's history were reviewed and updated as appropriate: allergies, current medications, past family history, past medical history, past social history, past surgical history and problem list. Problem list updated.   Objective  Blood pressure 110/64, weight 126 lb (57.2 kg), last menstrual period 03/25/2018. Pregravid weight 125 lb (56.7 kg) Total Weight Gain 1 lb (0.454 kg) Urinalysis: Urine Protein    Urine Glucose    Fetal Status: Fetal Heart Rate (bpm): present   Movement: Absent     General:  Alert, oriented and cooperative. Patient is in no acute distress.  Skin: Skin is warm and dry. No rash noted.   Cardiovascular: Normal heart rate noted  Respiratory: Normal respiratory effort, no problems with respiration noted  Abdomen: Soft, gravid, appropriate for gestational age. Pain/Pressure: Absent     Pelvic:  Cervical exam deferred        Extremities: Normal range of motion.     Mental Status: Normal mood and affect. Normal behavior. Normal judgment and thought content.   Imaging Results US Ob Comp + 14 Wk  Result Date: 08/10/2018 Patient Name: Sherry Welch DOB: 1995/07/07 MRN: [id number] ULTRASOUND REPORT Location: [facility] Date of Service: 08/10/2018 Indications:Anatomy Ultrasound Findings: Nelda [place]  intrauterine pregnancy is visualized with FHR at 152 BPM. Biometrics give an (U/S) Gestational age of [redacted]w[redacted]d and an (U/S) EDD of 12/30/2018; this correlates with the clinically established Estimated Date of Delivery: 12/30/18 Fetal presentation is Variable. EFW: 313 grams (11 oz). Placenta: posterior. Grade: 0 AFI: subjectively normal. Anatomic survey is complete and normal; Gender - female.  Right Ovary is normal in appearance. Left Ovary is normal appearance. Survey of the adnexa demonstrates no adnexal masses. There is no free peritoneal fluid in the cul de sac. Impression: 1. [redacted]w[redacted]d Viable Singleton Intrauterine pregnancy by U/S. 2. (U/S) EDD is consistent with Clinically established Estimated Date of Delivery: 12/30/18 . 3. Normal Anatomy Scan Lillia Dallas RDMS There is a singleton gestation with subjectively normal amniotic fluid volume. The fetal biometry correlates with established dating. Detailed evaluation of the fetal anatomy was performed.The fetal anatomical survey appears within normal limits within the resolution of ultrasound as described above.  It must be noted that a normal ultrasound is unable to rule out fetal aneuploidy nor is it able to detect all possible malformations.   The ultrasound images and findings were reviewed by me and I agree with the above report. Prentice Docker, MD, [facility], [facility] 08/10/2018 8:53 AM       Assessment   23 y.o. G1P0000 at [redacted]w[redacted]d by  12/30/2018, by Last Menstrual Period presenting for routine prenatal visit  Plan   FIRST Problems (from 05/04/18 to present)    Problem Noted Resolved   Supervision of normal first pregnancy 05/15/2018 by Will Bonnet, MD No   Overview Addendum 06/12/2018  3:31 PM by Will Bonnet, MD    [facility] Prenatal Labs  Dating LMP=5 Blood type:   O positive  Genetic Screen 1  Screen: declines   AFP:    Antibody: negative  Anatomic [place]  Rubella:   Immune Varicella: nonimmune  GTT  Early:               Third trimester:  RPR:   non reactive  Rhogam  HBsAg:   negative  TDaP vaccine                       Flu Shot: HIV:   negative  Baby Food                                GBS:   Contraception  Pap:  CBB     CS/VBAC    Support Person             Preterm labor symptoms and general obstetric precautions including but not limited to vaginal bleeding, contractions, leaking of fluid and fetal movement were reviewed in detail with the patient. Please refer to After Visit Summary for other counseling recommendations.   Return in about 4 weeks (around 09/07/2018) for Routine Prenatal Appointment.  Prentice Docker, MD, [facility], [facility] 08/10/2018 9:04 AM

## 2018-09-07 ENCOUNTER — Encounter: Payer: BLUE CROSS/BLUE SHIELD | Admitting: Obstetrics and Gynecology

## 2018-09-08 ENCOUNTER — Ambulatory Visit (INDEPENDENT_AMBULATORY_CARE_PROVIDER_SITE_OTHER): Payer: BLUE CROSS/BLUE SHIELD | Admitting: Obstetrics and Gynecology

## 2018-09-08 ENCOUNTER — Encounter: Payer: Self-pay | Admitting: Obstetrics and Gynecology

## 2018-09-08 ENCOUNTER — Other Ambulatory Visit: Payer: Self-pay

## 2018-09-08 ENCOUNTER — Other Ambulatory Visit (HOSPITAL_COMMUNITY)
Admission: RE | Admit: 2018-09-08 | Discharge: 2018-09-08 | Disposition: A | Discharge status: Home / Self Care | Payer: BLUE CROSS/BLUE SHIELD | Source: Ambulatory Visit | Attending: Obstetrics and Gynecology | Admitting: Obstetrics and Gynecology

## 2018-09-08 VITALS — BP 114/74 | Wt 130.0 lb

## 2018-09-08 DIAGNOSIS — Z113 Encounter for screening for infections with a predominantly sexual mode of transmission: Secondary | ICD-10-CM | POA: Insufficient documentation

## 2018-09-08 DIAGNOSIS — Z3402 Encounter for supervision of normal first pregnancy, second trimester: Secondary | ICD-10-CM | POA: Diagnosis not present

## 2018-09-08 DIAGNOSIS — Z131 Encounter for screening for diabetes mellitus: Secondary | ICD-10-CM

## 2018-09-08 DIAGNOSIS — Z3A23 23 weeks gestation of pregnancy: Secondary | ICD-10-CM

## 2018-09-08 LAB — POCT URINALYSIS DIPSTICK OB
Glucose, UA: NEGATIVE
POC,PROTEIN,UA: NEGATIVE

## 2018-09-08 NOTE — Progress Notes (Signed)
  Routine Prenatal Care Visit  Subjective  Sherry Welch is a 22 y.o. G1P0000 at [redacted]w[redacted]d being seen today for ongoing prenatal care.  She is currently monitored for the following issues for this low-risk pregnancy and has Iron deficiency anemia; Thrombocytosis ([place]); and Supervision of normal first pregnancy on their problem list.  ----------------------------------------------------------------------------------- Patient reports no complaints.   Contractions: Not present. Vag. Bleeding: None.  Movement: Present. Denies leaking of fluid.  ----------------------------------------------------------------------------------- The following portions of the patient's history were reviewed and updated as appropriate: allergies, current medications, past family history, past medical history, past social history, past surgical history and problem list. Problem list updated.   Objective  Blood pressure 114/74, weight 130 lb (59 kg), last menstrual period 03/25/2018. Pregravid weight 125 lb (56.7 kg) Total Weight Gain 5 lb (2.268 kg) Urinalysis: Urine Protein Negative  Urine Glucose Negative  Fetal Status: Fetal Heart Rate (bpm): 140   Movement: Present     General:  Alert, oriented and cooperative. Patient is in no acute distress.  Skin: Skin is warm and dry. No rash noted.   Cardiovascular: Normal heart rate noted  Respiratory: Normal respiratory effort, no problems with respiration noted  Abdomen: Soft, gravid, appropriate for gestational age. Pain/Pressure: Absent     Pelvic:  Cervical exam deferred        Extremities: Normal range of motion.     Mental Status: Normal mood and affect. Normal behavior. Normal judgment and thought content.   Assessment   23 y.o. G1P0000 at [redacted]w[redacted]d by  12/30/2018, by Last Menstrual Period presenting for routine prenatal visit  Plan   FIRST Problems (from 05/04/18 to present)    Problem Noted Resolved   Supervision of normal first pregnancy 05/15/2018 by  Will Bonnet, MD No   Overview Addendum 06/12/2018  3:31 PM by Will Bonnet, MD    [facility] Prenatal Labs  Dating LMP=5 Blood type:   O positive  Genetic Screen 1 Screen: declines   AFP:    Antibody: negative  Anatomic [place]  Rubella:   Immune Varicella: nonimmune  GTT Early:               Third trimester:  RPR:   non reactive  Rhogam  HBsAg:   negative  TDaP vaccine                       Flu Shot: HIV:   negative  Baby Food                                GBS:   Contraception  Pap:  CBB     CS/VBAC    Support Person               Preterm labor symptoms and general obstetric precautions including but not limited to vaginal bleeding, contractions, leaking of fluid and fetal movement were reviewed in detail with the patient. Please refer to After Visit Summary for other counseling recommendations.   - she mentions a positive chlamydia result a former partner of her husband has had. She believes the relationship was remote.  After discussion, testing was performed for STI screening just to be safe.   Return in about 4 weeks (around 10/06/2018) for 28 week labs/routine prenatal.  Prentice Docker, MD, [place], [facility] 09/08/2018 8:45 AM

## 2018-09-12 LAB — CERVICOVAGINAL ANCILLARY ONLY
Chlamydia: NEGATIVE
Neisseria Gonorrhea: NEGATIVE
Trichomonas: NEGATIVE

## 2018-09-15 ENCOUNTER — Telehealth: Payer: Self-pay

## 2018-09-15 NOTE — Telephone Encounter (Signed)
Pt called triage asking for test results from last Friday 09/08/18. Called her back and pt is aware gonorrhea/chlamydia results are negative.

## 2018-10-09 ENCOUNTER — Other Ambulatory Visit: Payer: BLUE CROSS/BLUE SHIELD

## 2018-10-09 ENCOUNTER — Ambulatory Visit (INDEPENDENT_AMBULATORY_CARE_PROVIDER_SITE_OTHER): Payer: BLUE CROSS/BLUE SHIELD | Admitting: Obstetrics and Gynecology

## 2018-10-09 ENCOUNTER — Encounter: Payer: Self-pay | Admitting: Obstetrics and Gynecology

## 2018-10-09 ENCOUNTER — Other Ambulatory Visit: Payer: Self-pay

## 2018-10-09 VITALS — BP 118/74 | Wt 134.0 lb

## 2018-10-09 DIAGNOSIS — D473 Essential (hemorrhagic) thrombocythemia: Secondary | ICD-10-CM

## 2018-10-09 DIAGNOSIS — Z3402 Encounter for supervision of normal first pregnancy, second trimester: Secondary | ICD-10-CM

## 2018-10-09 DIAGNOSIS — D75839 Thrombocytosis, unspecified: Secondary | ICD-10-CM

## 2018-10-09 DIAGNOSIS — Z131 Encounter for screening for diabetes mellitus: Secondary | ICD-10-CM

## 2018-10-09 DIAGNOSIS — Z113 Encounter for screening for infections with a predominantly sexual mode of transmission: Secondary | ICD-10-CM

## 2018-10-09 DIAGNOSIS — Z3403 Encounter for supervision of normal first pregnancy, third trimester: Secondary | ICD-10-CM

## 2018-10-09 DIAGNOSIS — D509 Iron deficiency anemia, unspecified: Secondary | ICD-10-CM

## 2018-10-09 DIAGNOSIS — Z3A28 28 weeks gestation of pregnancy: Secondary | ICD-10-CM

## 2018-10-09 LAB — POCT URINALYSIS DIPSTICK OB
Glucose, UA: NEGATIVE
POC,PROTEIN,UA: NEGATIVE

## 2018-10-09 NOTE — Progress Notes (Signed)
  Routine Prenatal Care Visit  Subjective  Sherry Welch is a 23 y.o. G1P0000 at [redacted]w[redacted]d being seen today for ongoing prenatal care.  She is currently monitored for the following issues for this low-risk pregnancy and has Iron deficiency anemia; Thrombocytosis ([place]); and Supervision of normal first pregnancy on their problem list.  ----------------------------------------------------------------------------------- Patient reports no complaints.   Contractions: Not present. Vag. Bleeding: None.  Movement: Present. Denies leaking of fluid.  ----------------------------------------------------------------------------------- The following portions of the patient's history were reviewed and updated as appropriate: allergies, current medications, past family history, past medical history, past social history, past surgical history and problem list. Problem list updated.   Objective  Blood pressure 118/74, weight 134 lb (60.8 kg), last menstrual period 03/25/2018. Pregravid weight 125 lb (56.7 kg) Total Weight Gain 9 lb (4.082 kg) Urinalysis: Urine Protein    Urine Glucose    Fetal Status: Fetal Heart Rate (bpm): 148 Fundal Height: 28 cm Movement: Present  Presentation: Vertex  General:  Alert, oriented and cooperative. Patient is in no acute distress.  Skin: Skin is warm and dry. No rash noted.   Cardiovascular: Normal heart rate noted  Respiratory: Normal respiratory effort, no problems with respiration noted  Abdomen: Soft, gravid, appropriate for gestational age. Pain/Pressure: Absent     Pelvic:  Cervical exam deferred        Extremities: Normal range of motion.     Mental Status: Normal mood and affect. Normal behavior. Normal judgment and thought content.   Assessment   23 y.o. G1P0000 at [redacted]w[redacted]d by  12/30/2018, by Last Menstrual Period presenting for routine prenatal visit  Plan   FIRST Problems (from 05/04/18 to present)    Problem Noted Resolved   Supervision of normal first  pregnancy 05/15/2018 by Will Bonnet, MD No   Overview Addendum 06/12/2018  3:31 PM by Will Bonnet, MD    [facility] Prenatal Labs  Dating LMP=5 Blood type:   O positive  Genetic Screen 1 Screen: declines   AFP:    Antibody: negative  Anatomic [place]  Rubella:   Immune Varicella: nonimmune  GTT Early:               Third trimester:  RPR:   non reactive  Rhogam  HBsAg:   negative  TDaP vaccine                       Flu Shot: HIV:   negative  Baby Food                                GBS:   Contraception  Pap:  CBB     CS/VBAC    Support Person              Preterm labor symptoms and general obstetric precautions including but not limited to vaginal bleeding, contractions, leaking of fluid and fetal movement were reviewed in detail with the patient. Please refer to After Visit Summary for other counseling recommendations.   - 28 week labs today  Return in about 2 weeks (around 10/23/2018) for Routine Prenatal Appointment/telephone.  Prentice Docker, MD, [facility], [facility] 10/09/2018 9:16 AM

## 2018-10-09 NOTE — Addendum Note (Signed)
Addended by: Brien Few on: 10/09/2018 09:25 AM   Modules accepted: Orders

## 2018-10-10 LAB — 28 WEEK RH+PANEL
Basophils Absolute: 0 10*3/uL (ref 0.0–0.2)
Basos: 0 %
EOS (ABSOLUTE): 0.1 10*3/uL (ref 0.0–0.4)
Eos: 1 %
Gestational Diabetes Screen: 83 mg/dL (ref 65–139)
HIV Screen 4th Generation wRfx: NONREACTIVE
Hematocrit: 34.9 % (ref 34.0–46.6)
Hemoglobin: 11.8 g/dL (ref 11.1–15.9)
Immature Grans (Abs): 0 10*3/uL (ref 0.0–0.1)
Immature Granulocytes: 0 %
Lymphocytes Absolute: 1.2 10*3/uL (ref 0.7–3.1)
Lymphs: 17 %
MCH: 32.2 pg (ref 26.6–33.0)
MCHC: 33.8 g/dL (ref 31.5–35.7)
MCV: 95 fL (ref 79–97)
Monocytes Absolute: 0.4 10*3/uL (ref 0.1–0.9)
Monocytes: 6 %
Neutrophils Absolute: 5 10*3/uL (ref 1.4–7.0)
Neutrophils: 76 %
Platelets: 200 10*3/uL (ref 150–450)
RBC: 3.66 x10E6/uL — ABNORMAL LOW (ref 3.77–5.28)
RDW: 12.3 % (ref 11.7–15.4)
RPR Ser Ql: NONREACTIVE
WBC: 6.7 10*3/uL (ref 3.4–10.8)

## 2018-10-23 ENCOUNTER — Other Ambulatory Visit: Payer: Self-pay

## 2018-10-23 ENCOUNTER — Ambulatory Visit (INDEPENDENT_AMBULATORY_CARE_PROVIDER_SITE_OTHER): Payer: BLUE CROSS/BLUE SHIELD | Admitting: Obstetrics & Gynecology

## 2018-10-23 ENCOUNTER — Encounter: Payer: Self-pay | Admitting: Obstetrics & Gynecology

## 2018-10-23 VITALS — Wt 135.0 lb

## 2018-10-23 DIAGNOSIS — Z3403 Encounter for supervision of normal first pregnancy, third trimester: Secondary | ICD-10-CM

## 2018-10-23 DIAGNOSIS — Z3A30 30 weeks gestation of pregnancy: Secondary | ICD-10-CM | POA: Diagnosis not present

## 2018-10-23 NOTE — Progress Notes (Signed)
Virtual Visit via Telephone Note  I connected with patient on 10/23/18 at 10:20 AM EDT by telephone and verified that I am speaking with the correct person using two identifiers.   I discussed the limitations, risks, security and privacy concerns of performing an evaluation and management service by telephone and the availability of in person appointments. I also discussed with the patient that there may be a patient responsible charge related to this service. The patient expressed understanding and agreed to proceed.  The patient was at home I spoke with the patient from my  office  Sherry Welch is a 23 y.o. G1P0000 at [redacted]w[redacted]d being seen today for ongoing prenatal care.  She is currently monitored for the following issues for this low-risk pregnancy and has Iron deficiency anemia; Thrombocytosis ([place]); and Supervision of normal first pregnancy on their problem list.  ----------------------------------------------------------------------------------- Patient reports no complaints.   Denies pain, VB, leaking of fluid.  ----------------------------------------------------------------------------------- The following portions of the patient's history were reviewed and updated as appropriate: allergies, current medications, past family history, past medical history, past social history, past surgical history and problem list. Problem list updated.   Objective  Weight 135 lb (61.2 kg), last menstrual period 03/25/2018. Pregravid weight 125 lb (56.7 kg) Total Weight Gain 10 lb (4.536 kg)  Physical Exam could not be performed. Because of the COVID-19 outbreak this visit was performed over the phone and not in person.   Assessment   23 y.o. G1P0000 at [redacted]w[redacted]d by  12/30/2018, by Last Menstrual Period presenting for routine prenatal visit  Plan   FIRST Problems (from 05/04/18 to present)    Problem Noted Resolved   Supervision of normal first pregnancy 05/15/2018 by Will Bonnet, MD No   Overview Addendum 10/23/2018 10:40 AM by Gae Dry, MD    [facility] Prenatal Labs  Dating LMP=5 Blood type:   O positive  Genetic Screen 1 Screen: declines     Antibody: negative  Anatomic [place] [facility] Rubella:   Immune Varicella: nonimmune  GTT 83  RPR:   non reactive  Rhogam O+ HBsAg:   negative  TDaP vaccine            Flu Shot: HIV:   negative  Baby Food      breast                          GBS:   Contraception      POP Pap: 06/2017 nml  CBB  no   CS/VBAC n/a   Support Person Husband Winferd Humphrey            Discussed contraception today  PNV, [facility]  Gestational age appropriate obstetric precautions including but not limited to vaginal bleeding, contractions, leaking of fluid and fetal movement were reviewed in detail with the patient.     Follow Up Instructions: 2 weeks   I discussed the assessment and treatment plan with the patient. The patient was provided an opportunity to ask questions and all were answered. The patient agreed with the plan and demonstrated an understanding of the instructions.   The patient was advised to call back or seek an in-person evaluation if the symptoms worsen or if the condition fails to improve as anticipated.  I provided 10 minutes of non-face-to-face time during this encounter.  Return in about 2 weeks (around 11/06/2018) for [place] in office.  Barnett Applebaum, MD [facility], [facility] 10/23/2018 10:42 AM

## 2018-11-06 ENCOUNTER — Encounter: Payer: BLUE CROSS/BLUE SHIELD | Admitting: Obstetrics and Gynecology

## 2019-03-24 ENCOUNTER — Encounter (HOSPITAL_COMMUNITY): Payer: Self-pay
